# Patient Record
Sex: Male | Born: 1980 | Race: Black or African American | Hispanic: No | Marital: Single | Smoking: Never smoker
Health system: Southern US, Community
[De-identification: ages and names within clinical notes are randomized; demographics above are authoritative.]

## PROBLEM LIST (undated history)

## (undated) DIAGNOSIS — I10 Essential (primary) hypertension: Secondary | ICD-10-CM

## (undated) DIAGNOSIS — K029 Dental caries, unspecified: Secondary | ICD-10-CM

## (undated) HISTORY — PX: HERNIA REPAIR: SHX51

## (undated) HISTORY — DX: Essential (primary) hypertension: I10

---

## 2003-03-04 ENCOUNTER — Emergency Department (HOSPITAL_COMMUNITY): Admission: EM | Admit: 2003-03-04 | Discharge: 2003-03-04 | Payer: Self-pay | Admitting: Emergency Medicine

## 2005-03-04 IMAGING — CR DG ABDOMEN ACUTE W/ 1V CHEST
3 series · 3 of 3 positions shown · non-contrast
Comparison: None.

CLINICAL DATA: Upper abdominal pain, nausea/vomiting; cough, fever.
 ACUTE ABDOMEN SERIES (AP AND ERECT ABDOMEN WITH PA CHEST)  03/04/03 AT 5621 HOURS

[view not recorded (1 of 3)]
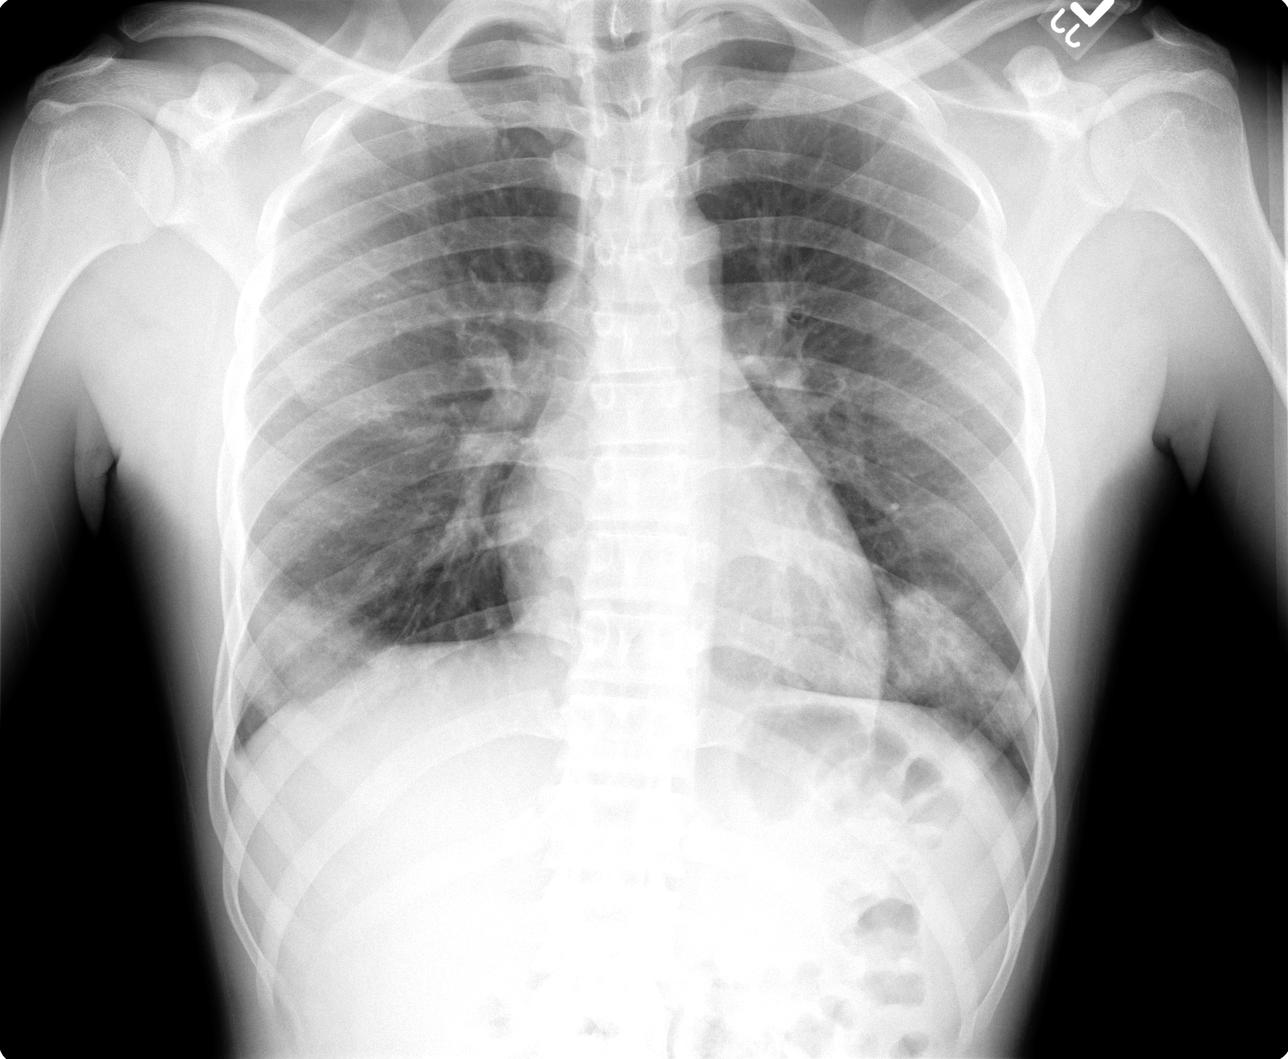

[view not recorded (2 of 3)]
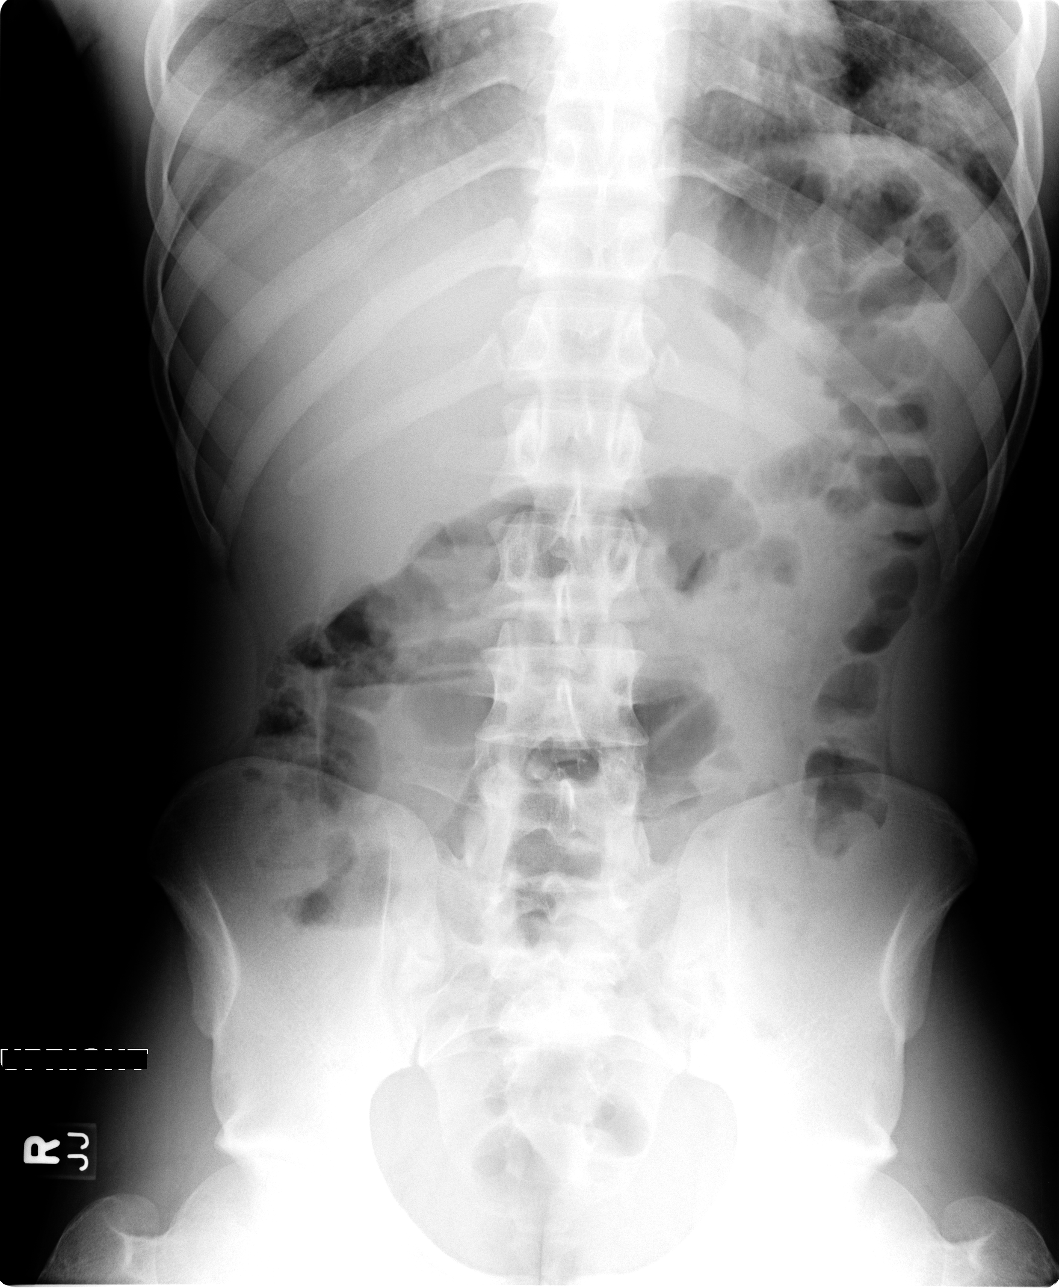

[view not recorded (3 of 3)]
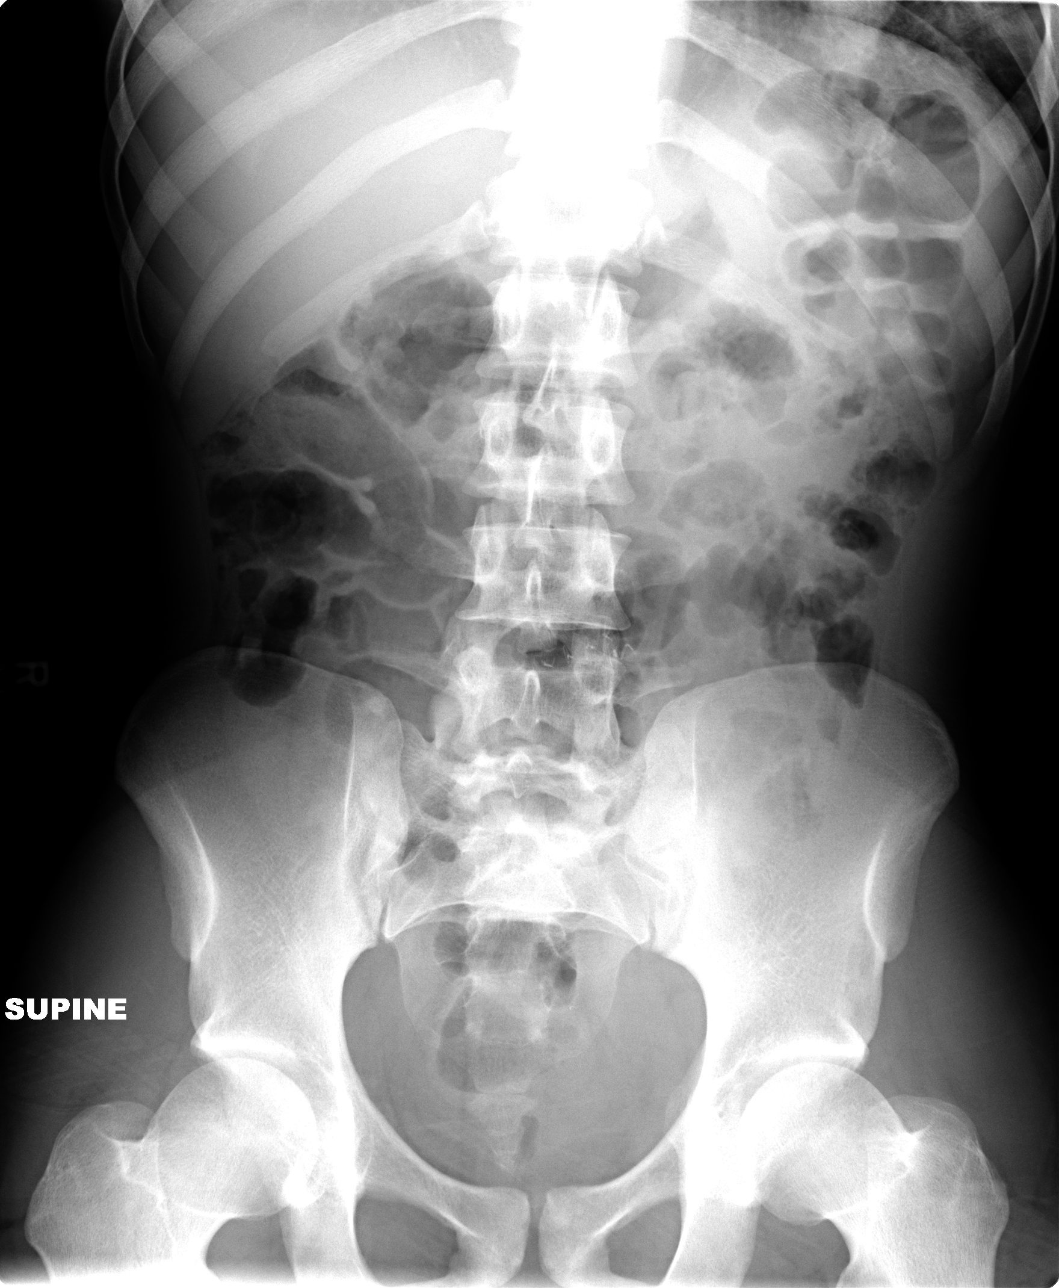

[3 of 3 positions shown; findings below may reference images not displayed]

The bowel gas pattern is unremarkable, and there is no evidence of obstruction or free air.  No abnormal calcifications are identified.  There is surgical suture material present within the mid abdomen.  Skeletal structures appear intact.
 The accompanying PA chest film demonstrates consolidation in both lower lobes.  The cardiomediastinal silhouette is unremarkable.  
 IMPRESSION
 1.  No acute abdominal abnormality.
 2.  Bilateral lower lobe pneumonia.

## 2010-09-13 ENCOUNTER — Emergency Department (HOSPITAL_COMMUNITY): Payer: Self-pay

## 2010-09-13 ENCOUNTER — Emergency Department (HOSPITAL_COMMUNITY)
Admission: EM | Admit: 2010-09-13 | Discharge: 2010-09-13 | Disposition: A | Discharge status: Home / Self Care | Payer: Self-pay | Attending: Emergency Medicine | Admitting: Emergency Medicine

## 2010-09-13 DIAGNOSIS — R079 Chest pain, unspecified: Secondary | ICD-10-CM | POA: Insufficient documentation

## 2010-09-13 LAB — BASIC METABOLIC PANEL
CO2: 27 mEq/L (ref 19–32)
Chloride: 101 mEq/L (ref 96–112)
Potassium: 3.9 mEq/L (ref 3.5–5.1)
Sodium: 137 mEq/L (ref 135–145)

## 2010-09-13 LAB — DIFFERENTIAL
Basophils Relative: 1 % (ref 0–1)
Eosinophils Absolute: 0.1 10*3/uL (ref 0.0–0.7)
Lymphs Abs: 3.7 10*3/uL (ref 0.7–4.0)
Neutrophils Relative %: 36 % — ABNORMAL LOW (ref 43–77)

## 2010-09-13 LAB — CBC
MCV: 82.1 fL (ref 78.0–100.0)
Platelets: 250 10*3/uL (ref 150–400)
RBC: 5.42 MIL/uL (ref 4.22–5.81)
WBC: 6.9 10*3/uL (ref 4.0–10.5)

## 2010-09-13 LAB — POCT I-STAT TROPONIN I

## 2012-09-13 IMAGING — CR DG CHEST 2V
2 series · 2 of 2 positions shown · non-contrast
Comparison: None

CLINICAL DATA: Chest pain

CHEST - 2 VIEW

[w chest pa]
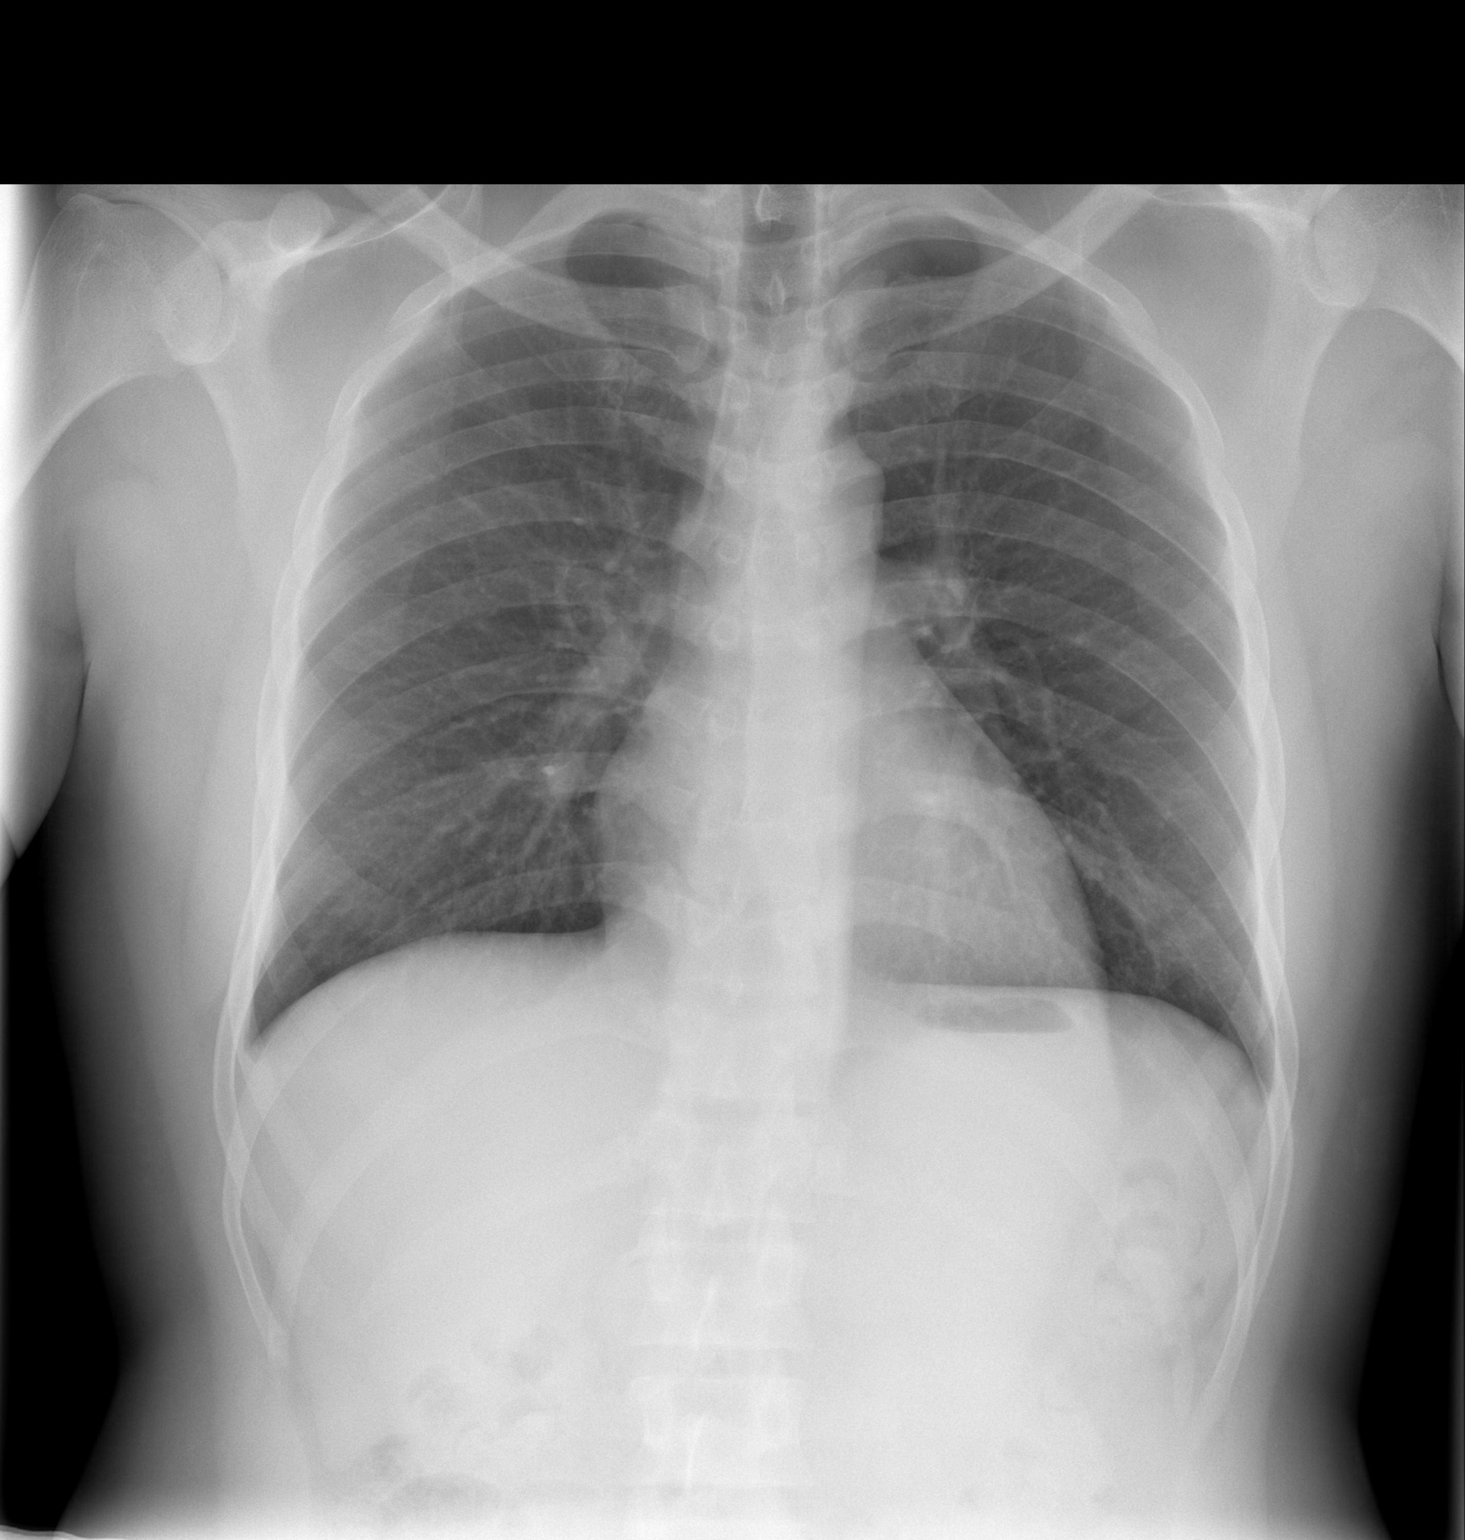

[w chest lat]
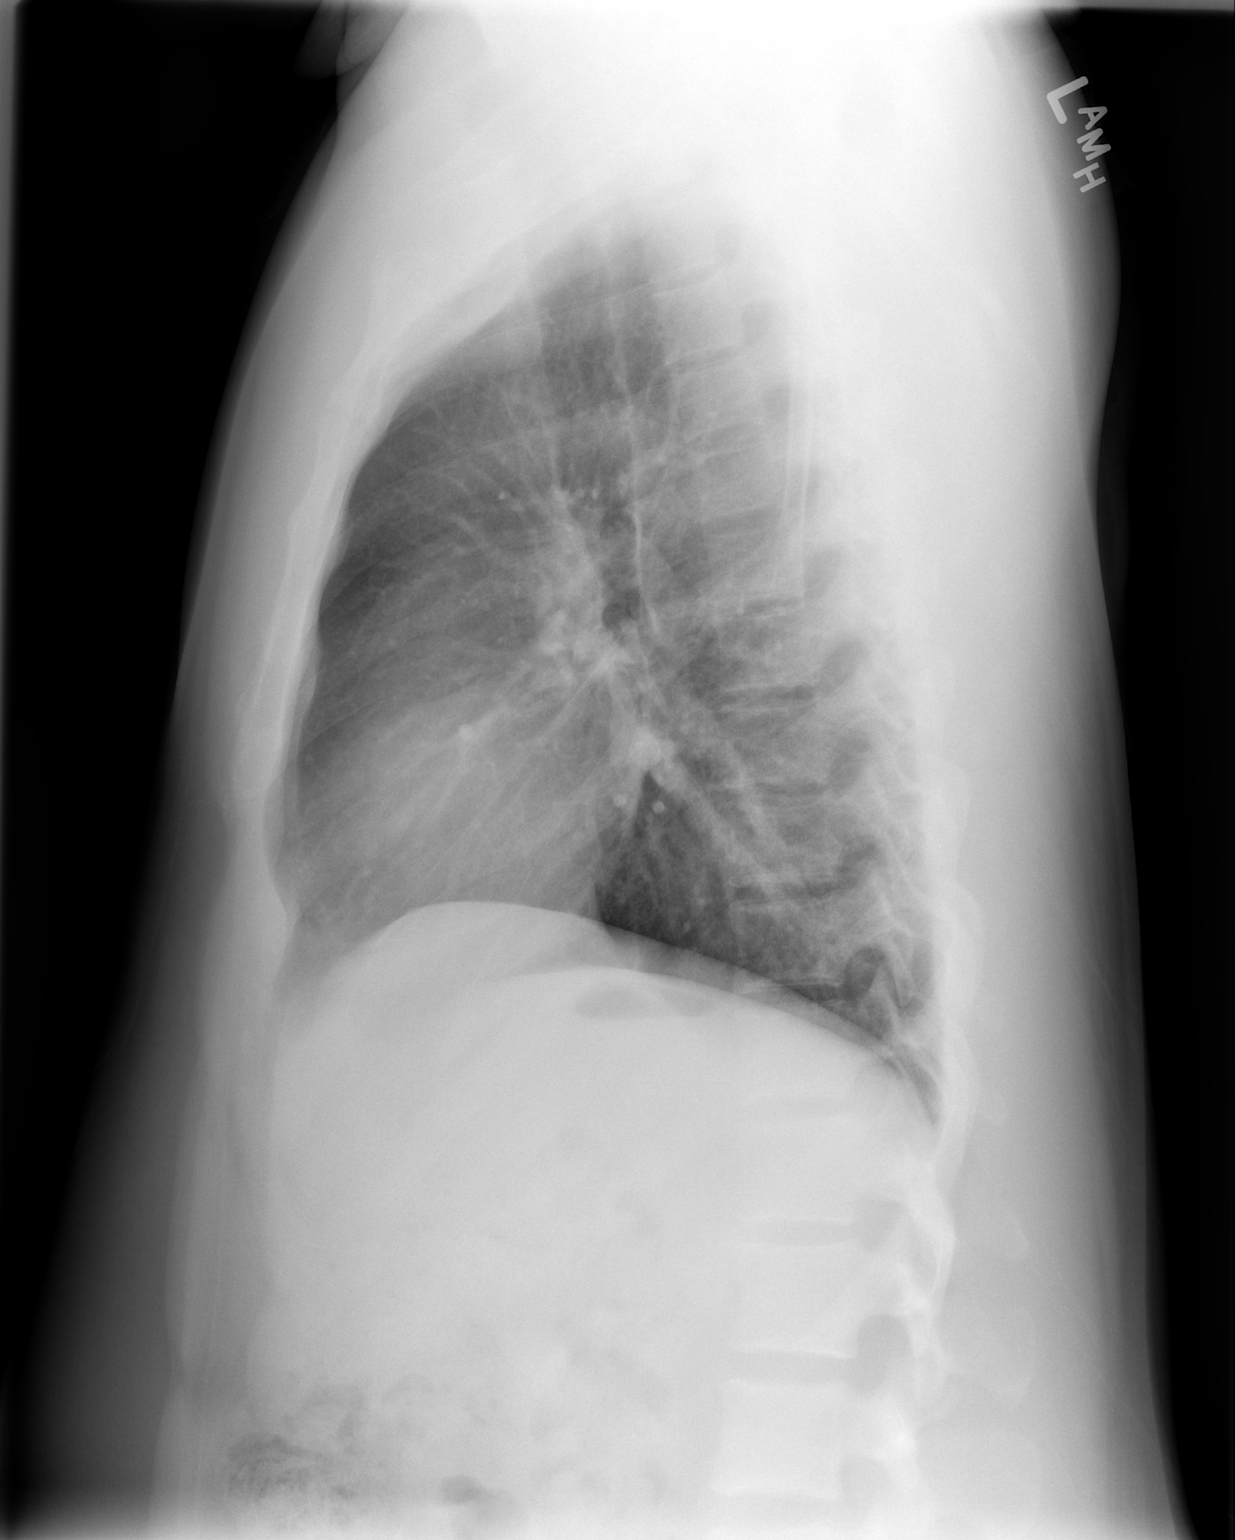

[2 of 2 positions shown; findings below may reference images not displayed]

FINDINGS: The heart size and mediastinal contours are within normal
limits.  Both lungs are clear.  The visualized skeletal structures
are unremarkable.
IMPRESSION: No active disease.

## 2012-09-15 ENCOUNTER — Encounter (HOSPITAL_COMMUNITY): Payer: Self-pay | Admitting: Emergency Medicine

## 2012-09-15 ENCOUNTER — Emergency Department (HOSPITAL_COMMUNITY)
Admission: EM | Admit: 2012-09-15 | Discharge: 2012-09-15 | Disposition: A | Discharge status: Home / Self Care | Payer: Self-pay | Attending: Emergency Medicine | Admitting: Emergency Medicine

## 2012-09-15 DIAGNOSIS — Z23 Encounter for immunization: Secondary | ICD-10-CM | POA: Insufficient documentation

## 2012-09-15 DIAGNOSIS — W268XXA Contact with other sharp object(s), not elsewhere classified, initial encounter: Secondary | ICD-10-CM | POA: Insufficient documentation

## 2012-09-15 DIAGNOSIS — Y9389 Activity, other specified: Secondary | ICD-10-CM | POA: Insufficient documentation

## 2012-09-15 DIAGNOSIS — Y9289 Other specified places as the place of occurrence of the external cause: Secondary | ICD-10-CM | POA: Insufficient documentation

## 2012-09-15 DIAGNOSIS — S61219A Laceration without foreign body of unspecified finger without damage to nail, initial encounter: Secondary | ICD-10-CM

## 2012-09-15 DIAGNOSIS — S61209A Unspecified open wound of unspecified finger without damage to nail, initial encounter: Secondary | ICD-10-CM | POA: Insufficient documentation

## 2012-09-15 HISTORY — DX: Dental caries, unspecified: K02.9

## 2012-09-15 MED ORDER — TETANUS-DIPHTH-ACELL PERTUSSIS 5-2.5-18.5 LF-MCG/0.5 IM SUSP
0.5000 mL | Freq: Once | INTRAMUSCULAR | Status: AC
  Administered 2012-09-15: 0.5 mL via INTRAMUSCULAR
  Filled 2012-09-15: qty 0.5

## 2012-09-15 NOTE — ED Provider Notes (Signed)
CSN: [id number]     Arrival date & time 09/15/12  1357 History   First MD Initiated Contact with Patient 09/15/12 1411     No chief complaint on file.  (Consider location/radiation/quality/duration/timing/severity/associated sxs/prior Treatment) HPI Comments: Patient reports he was cleaning out his car when he accidentally cut his right index finger on a straight razor that was under his seat. Reports he is having trouble stopping the bleeding. He is not on blood thinners. Denies weakness or numbness of the finger or difficulty moving his finger. Denies other injury. Unsure of last tetanus vaccination  The history is provided by the patient.    No past medical history on file. No past surgical history on file. No family history on file. History  Substance Use Topics  . Smoking status: Not on file  . Smokeless tobacco: Not on file  . Alcohol Use: Not on file    Review of Systems  Skin: Positive for wound. Negative for color change and pallor.  Neurological: Negative for weakness and numbness.    Allergies  Review of patient's allergies indicates not on file.  Home Medications  No current outpatient prescriptions on file. There were no vitals taken for this visit. Physical Exam  Nursing note and vitals reviewed. Constitutional: He appears well-developed and well-nourished. No distress.  HENT:  Head: Normocephalic and atraumatic.  Neck: Neck supple.  Pulmonary/Chest: Effort normal.  Musculoskeletal:       Right hand: He exhibits laceration. He exhibits normal range of motion, no bony tenderness, normal capillary refill, no deformity and no swelling. Normal sensation noted. Normal strength noted.       Hands: Neurological: He is alert.  Skin: He is not diaphoretic.    ED Course  Procedures (including critical care time) Labs Review Labs Reviewed - No data to display Imaging Review No results found. 2:20 PM patient seen and examined-he has a small laceration on his  distal tuft of his right index finger. Neurovascularly intact. I discussed the risks and benefits associated with closing the wound versus leaving it open. Patient states he does not like needles and does not want a digital block nor does he want sutures in his finger. He is on amoxicillin from a recent dental visit, which will cover him to prevent infection. We will thoroughly cleansed the wound and close it with Steri-Strips. Patient advised that his healing may be slower and that his risk of infection is increased as is his risk of wound dehiscence and bleeding. Patient verbalizes understanding.  MDM   1. Laceration of finger, initial encounter    Patient with small laceration to the distal end of right index finger. Wound thoroughly cleansed by nurse and Steri-Strips applied. Wound was hemostatic after Steri-Strips application. Wound bandaged here. Patient is to continue his amoxicillin at home to prevent infection. Patient to return for worsening symptoms including difficulty bending or straightening his finger, redness, swelling, or purulent drainage, or fevers.  Discussed result, findings, treatment, and follow up  with patient.  Pt given return precautions.  Pt verbalizes understanding and agrees with plan.        Trixie Dredge, PA-C 09/15/12 1500

## 2012-09-15 NOTE — Progress Notes (Signed)
[facility] provided pt with a list of primary care resources in [facility]. Patient stated that he had insurance, he was just waiting on his card.

## 2012-09-15 NOTE — ED Notes (Signed)
Pt attempt to pick up a discarded razor blade. Tip of 2nd finger r/hand has shallow laceration

## 2012-09-21 NOTE — ED Provider Notes (Signed)
Medical screening examination/treatment/procedure(s) were performed by non-physician practitioner and as supervising physician I was immediately available for consultation/collaboration.  Tremon Sainvil, MD 09/21/12 2101 

## 2013-03-13 ENCOUNTER — Emergency Department (HOSPITAL_COMMUNITY): Payer: Self-pay

## 2013-03-13 ENCOUNTER — Encounter (HOSPITAL_COMMUNITY): Payer: Self-pay | Admitting: Emergency Medicine

## 2013-03-13 ENCOUNTER — Emergency Department (HOSPITAL_COMMUNITY)
Admission: EM | Admit: 2013-03-13 | Discharge: 2013-03-13 | Disposition: A | Discharge status: Home / Self Care | Payer: Self-pay | Attending: Emergency Medicine | Admitting: Emergency Medicine

## 2013-03-13 DIAGNOSIS — Z792 Long term (current) use of antibiotics: Secondary | ICD-10-CM | POA: Insufficient documentation

## 2013-03-13 DIAGNOSIS — R0789 Other chest pain: Secondary | ICD-10-CM | POA: Insufficient documentation

## 2013-03-13 DIAGNOSIS — R0602 Shortness of breath: Secondary | ICD-10-CM | POA: Insufficient documentation

## 2013-03-13 DIAGNOSIS — Z8719 Personal history of other diseases of the digestive system: Secondary | ICD-10-CM | POA: Insufficient documentation

## 2013-03-13 LAB — BASIC METABOLIC PANEL
BUN: 12 mg/dL (ref 6–23)
CALCIUM: 10.4 mg/dL (ref 8.4–10.5)
CO2: 28 mEq/L (ref 19–32)
Chloride: 99 mEq/L (ref 96–112)
Creatinine, Ser: 1.22 mg/dL (ref 0.50–1.35)
GFR, EST NON AFRICAN AMERICAN: 78 mL/min — AB (ref >90)
GLUCOSE: 98 mg/dL (ref 70–99)
POTASSIUM: 4 meq/L (ref 3.7–5.3)
SODIUM: 141 meq/L (ref 137–147)

## 2013-03-13 LAB — CBC
HCT: 44.3 % (ref 39.0–52.0)
Hemoglobin: 15.4 g/dL (ref 13.0–17.0)
MCH: 29.6 pg (ref 26.0–34.0)
MCHC: 34.8 g/dL (ref 30.0–36.0)
MCV: 85 fL (ref 78.0–100.0)
PLATELETS: 273 10*3/uL (ref 150–400)
RBC: 5.21 MIL/uL (ref 4.22–5.81)
RDW: 13.6 % (ref 11.5–15.5)
WBC: 5.5 10*3/uL (ref 4.0–10.5)

## 2013-03-13 LAB — I-STAT TROPONIN, ED: TROPONIN I, POC: 0 ng/mL (ref 0.00–0.08)

## 2013-03-13 LAB — D-DIMER, QUANTITATIVE: D-Dimer, Quant: <0.27 ug/mL-FEU (ref 0.00–0.48)

## 2013-03-13 MED ORDER — ASPIRIN 81 MG PO CHEW
324.0000 mg | CHEWABLE_TABLET | Freq: Once | ORAL | Status: AC
  Administered 2013-03-13: 324 mg via ORAL
  Filled 2013-03-13: qty 4

## 2013-03-13 NOTE — ED Notes (Addendum)
Pt reports that he has had sharp centralized intermittent CP for the past week, reports it causes ShOB, states nothing relieves or worsens the pain, reports numbness to his L arm. 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 Denies hx of blood clots. Pt a&o x4, ambulatory to triage.

## 2013-03-13 NOTE — Discharge Instructions (Signed)
Call and make an appointment to followup with a cardiologist. Return immediately for worsening and sustained chest pain.  Chest Pain (Nonspecific) It is often hard to give a specific diagnosis for the cause of chest pain. There is always a chance that your pain could be related to something serious, such as a heart attack or a blood clot in the lungs. You need to follow up with your caregiver for further evaluation. CAUSES   Heartburn.  Pneumonia or bronchitis.  Anxiety or stress.  Inflammation around your heart (pericarditis) or lung (pleuritis or pleurisy).  A blood clot in the lung.  A collapsed lung (pneumothorax). It can develop suddenly on its own (spontaneous pneumothorax) or from injury (trauma) to the chest.  Shingles infection (herpes zoster virus). The chest wall is composed of bones, muscles, and cartilage. Any of these can be the source of the pain.  The bones can be bruised by injury.  The muscles or cartilage can be strained by coughing or overwork.  The cartilage can be affected by inflammation and become sore (costochondritis). DIAGNOSIS  Lab tests or other studies, such as X-rays, electrocardiography, stress testing, or cardiac imaging, may be needed to find the cause of your pain.  TREATMENT   Treatment depends on what may be causing your chest pain. Treatment may include:  Acid blockers for heartburn.  Anti-inflammatory medicine.  Pain medicine for inflammatory conditions.  Antibiotics if an infection is present.  You may be advised to change lifestyle habits. This includes stopping smoking and avoiding alcohol, caffeine, and chocolate.  You may be advised to keep your head raised (elevated) when sleeping. This reduces the chance of acid going backward from your stomach into your esophagus.  Most of the time, nonspecific chest pain will improve within 2 to 3 days with rest and mild pain medicine. HOME CARE INSTRUCTIONS   If antibiotics were  prescribed, take your antibiotics as directed. Finish them even if you start to feel better.  For the next few days, avoid physical activities that bring on chest pain. Continue physical activities as directed.  Do not smoke.  Avoid drinking alcohol.  Only take over-the-counter or prescription medicine for pain, discomfort, or fever as directed by your caregiver.  Follow your caregiver's suggestions for further testing if your chest pain does not go away.  Keep any follow-up appointments you made. If you do not go to an appointment, you could develop lasting (chronic) problems with pain. If there is any problem keeping an appointment, you must call to reschedule. SEEK MEDICAL CARE IF:   You think you are having problems from the medicine you are taking. Read your medicine instructions carefully.  Your chest pain does not go away, even after treatment.  You develop a rash with blisters on your chest. SEEK IMMEDIATE MEDICAL CARE IF:   You have increased chest pain or pain that spreads to your arm, neck, jaw, back, or abdomen.  You develop shortness of breath, an increasing cough, or you are coughing up blood.  You have severe back or abdominal pain, feel nauseous, or vomit.  You develop severe weakness, fainting, or chills.  You have a fever. THIS IS AN EMERGENCY. Do not wait to see if the pain will go away. Get medical help at once. Call your local emergency services (911 in [place] Do not drive yourself to the hospital. MAKE SURE YOU:   Understand these instructions.  Will watch your condition.  Will get help right away if you are not  doing well or get worse. Document Released: 10/09/2004 Document Revised: 03/24/2011 Document Reviewed: 08/05/2007 [facility] Patient Information 2014 [employer].

## 2013-03-13 NOTE — ED Provider Notes (Signed)
CSN: [id number]     Arrival date & time 03/13/13  0424 History   First MD Initiated Contact with Patient 03/13/13 0444     Chief Complaint  Patient presents with  . Chest Pain     (Consider location/radiation/quality/duration/timing/severity/associated sxs/prior Treatment) HPI Patient has had one week of episodic left-sided chest pain. He states the episodes last 10-15 seconds. The pain is characterized as sharp and radiates up to his neck and left shoulder. Is associated with mild shortness of breath but no nausea or vomiting. He denies any cough, fevers or chills. The last episode of chest pain was while at work yesterday at 4 PM roughly 12 hours ago. The pain that time lasted 15 seconds and dissipated. Patient has no current pain or shortness of breath. Patient was seen in emergency Department roughly 3 years ago for similar symptoms. He had a negative workup at that time. Patient's never seen a cardiologist. He does not smoke cigarettes. 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 She's had no lower extremity swelling or pain. His leg father had MI in his 1350s. Past Medical History  Diagnosis Date  . Dental caries    Past Surgical History  Procedure Laterality Date  . Hernia repair     Family History  Problem Relation Age of Onset  . Diabetes Other   . Hypertension Other    History  Substance Use Topics  . Smoking status: Never Smoker   . Smokeless tobacco: Not on file  . Alcohol Use: Yes    Review of Systems  Constitutional: Negative for fever and chills.  Respiratory: Positive for shortness of breath. Negative for cough.   Cardiovascular: Positive for chest pain. Negative for palpitations and leg swelling.  Gastrointestinal: Negative for nausea, vomiting and abdominal pain.  Musculoskeletal: Negative for back pain, neck pain and neck stiffness.  Skin: Negative for rash.  Neurological: Negative for dizziness, weakness, light-headedness, numbness and headaches.   All other systems reviewed and are negative.      Allergies  Review of patient's allergies indicates no known allergies.  Home Medications   Current Outpatient Rx  Name  Route  Sig  Dispense  Refill  . amoxicillin (AMOXIL) 500 MG capsule   Oral   Take 500 mg by mouth 3 (three) times daily.          BP 121/85  Pulse 106  Temp(Src) 98.6 F (37 C) (Oral)  Resp 16  SpO2 100% Physical Exam  Nursing note and vitals reviewed. Constitutional: He is oriented to person, place, and time. He appears well-developed and well-nourished. No distress.  HENT:  Head: Normocephalic and atraumatic.  Mouth/Throat: Oropharynx is clear and moist.  Eyes: EOM are normal. Pupils are equal, round, and reactive to light.  Neck: Normal range of motion. Neck supple.  Cardiovascular: Normal rate and regular rhythm.  Exam reveals no gallop and no friction rub.   No murmur heard. Pulmonary/Chest: Effort normal and breath sounds normal. No respiratory distress. He has no wheezes. He has no rales. He exhibits no tenderness.  Abdominal: Soft. Bowel sounds are normal. He exhibits no distension and no mass. There is no tenderness. There is no rebound and no guarding.  Musculoskeletal: Normal range of motion. He exhibits no edema and no tenderness.  No calf swelling or pain.  Neurological: He is alert and oriented to person, place, and time.  Patient is alert and oriented x3 with clear, goal oriented speech. Patient has 5/5 motor in all  extremities. Sensation is intact to light touch. Patient has a normal gait and walks without assistance.   Skin: Skin is warm and dry. No rash noted. No erythema.  Psychiatric: He has a normal mood and affect. His behavior is normal.    ED Course  Procedures (including critical care time) Labs Review Labs Reviewed  CBC  BASIC METABOLIC PANEL  I-STAT TROPOININ, ED   Imaging Review No results found.   EKG Interpretation   Date/Time:  Sunday March 13 2013 04:32:08  EST Ventricular Rate:  101 PR Interval:  153 QRS Duration: 83 QT Interval:  336 QTC Calculation: 435 R Axis:   63 Text Interpretation:  Sinus tachycardia Borderline repolarization  abnormality ST elevation, consider anterior injury Confirmed by Ranae Palms   MD, Bita Cartwright ([id number]) on 03/13/2013 5:02:03 AM      MDM   Final diagnoses:  None    Discussed EKG with cardiology fellow on call. He reviewed the EKG as well as his old one. He stated that there is no emergent intervention needed at this time and changes likely represent J-point elevation. Patient is chest pain free at this time. Symptoms would be atypical for coronary artery disease.  Patient has a negative troponin 12 hours after episode of chest pain. He has a normal d-dimer. EKG is unchanged from his previous. I discuss with cardiology and we'll refer him for outpatient workup. His symptoms are very atypical for coronary artery disease. I believe he is safe for discharge home. Return precautions have been given and patient has voiced understanding.  Loren Racer, MD 03/13/13 [phone]

## 2015-06-15 ENCOUNTER — Emergency Department (HOSPITAL_COMMUNITY)
Admission: EM | Admit: 2015-06-15 | Discharge: 2015-06-15 | Disposition: A | Discharge status: Home / Self Care | Payer: Self-pay | Attending: Emergency Medicine | Admitting: Emergency Medicine

## 2015-06-15 ENCOUNTER — Encounter (HOSPITAL_COMMUNITY): Payer: Self-pay | Admitting: *Deleted

## 2015-06-15 DIAGNOSIS — K029 Dental caries, unspecified: Secondary | ICD-10-CM | POA: Insufficient documentation

## 2015-06-15 DIAGNOSIS — K002 Abnormalities of size and form of teeth: Secondary | ICD-10-CM | POA: Insufficient documentation

## 2015-06-15 DIAGNOSIS — K0889 Other specified disorders of teeth and supporting structures: Secondary | ICD-10-CM | POA: Insufficient documentation

## 2015-06-15 MED ORDER — TRAMADOL HCL 50 MG PO TABS: 50.0000 mg | ORAL_TABLET | Freq: Four times a day (QID) | ORAL | Status: DC | PRN

## 2015-06-15 MED ORDER — PENICILLIN V POTASSIUM 500 MG PO TABS: 500.0000 mg | ORAL_TABLET | Freq: Three times a day (TID) | ORAL | Status: DC

## 2015-06-15 MED ORDER — NAPROXEN 500 MG PO TABS: 500.0000 mg | ORAL_TABLET | Freq: Two times a day (BID) | ORAL | Status: DC

## 2015-06-15 NOTE — ED Notes (Signed)
Pt states dental pain to 2 teeth x 1 week.  States called his dentists office today but he is not in.

## 2015-06-15 NOTE — ED Provider Notes (Signed)
CSN: [id number]     Arrival date & time 06/15/15  [id number] History  By signing my name below, I, Jesse Trevino, attest that this documentation has been prepared under the direction and in the presence of Jesse CriglerJoshua Parthena Fergeson, PA-C. Electronically Signed: Evon Slackerrance Trevino, ED Scribe. 06/15/2015. 10:46 AM.    Chief Complaint  Patient presents with  . Dental Pain   The history is provided by the patient. No language interpreter was used.   HPI Comments: Jesse Trevino is a 35 y.o. male who presents to the Emergency Department complaining of upper and lower left sided dental pain onset 1 week prior. Pt states that he has some slight left side facial swelling. Pt states that he has been trying tylenol with no relief. Pt doesn't reports fever, ear pain, trouble swallowing. Pt states that he has a dentist appointment next week.   Past Medical History  Diagnosis Date  . Dental caries    Past Surgical History  Procedure Laterality Date  . Hernia repair     Family History  Problem Relation Age of Onset  . Diabetes Other   . Hypertension Other    Social History  Substance Use Topics  . Smoking status: Never Smoker   . Smokeless tobacco: Never Used  . Alcohol Use: Yes     Comment: socially    Review of Systems  Constitutional: Negative for fever.  HENT: Positive for dental problem and facial swelling. Negative for ear pain, sore throat and trouble swallowing.   Respiratory: Negative for shortness of breath and stridor.   Musculoskeletal: Negative for neck pain.  Skin: Negative for color change.  Neurological: Negative for headaches.      Allergies  Review of patient's allergies indicates no known allergies.  Home Medications   Prior to Admission medications   Not on File   BP 131/88 mmHg  Pulse 88  Temp(Src) 98.5 F (36.9 C) (Oral)  Resp 16  SpO2 98%   Physical Exam  Constitutional: He is oriented to person, place, and time. He appears well-developed and well-nourished. No  distress.  HENT:  Head: Normocephalic and atraumatic.  Right Ear: Tympanic membrane, external ear and ear canal normal.  Left Ear: Tympanic membrane, external ear and ear canal normal.  Nose: Nose normal.  Mouth/Throat: Uvula is midline, oropharynx is clear and moist and mucous membranes are normal. No trismus in the jaw. Abnormal dentition. Dental caries present. No dental abscesses or uvula swelling. No tonsillar abscesses.  No gross facial swelling. Patient has large cavity in tooth #16 and tooth #19. These are the areas where he is having pain.  Eyes: Conjunctivae and EOM are normal. Pupils are equal, round, and reactive to light.  Neck: Normal range of motion. Neck supple. No tracheal deviation present.  No neck swelling or Lugwig's angina  Cardiovascular: Normal rate.   Pulmonary/Chest: Effort normal. No respiratory distress.  Musculoskeletal: Normal range of motion.  Neurological: He is alert and oriented to person, place, and time.  Skin: Skin is warm and dry.  Psychiatric: He has a normal mood and affect. His behavior is normal.  Nursing note and vitals reviewed.   ED Course  Procedures (including critical care time) DIAGNOSTIC STUDIES: Oxygen Saturation is 98% on RA, normal by my interpretation.    COORDINATION OF CARE: 11:02 AM-Discussed treatment plan which includes being prescribed penicillin, tramadol and naproxen  with pt at bedside and pt agreed to plan.   Vital signs reviewed and are as follows: BP 131/88 mmHg  Pulse 88  Temp(Src) 98.5 F (36.9 C) (Oral)  Resp 16  SpO2 98%  Patient counseled on use of narcotic pain medications. Counseled not to combine these medications with others containing tylenol. Urged not to drink alcohol, drive, or perform any other activities that requires focus while taking these medications. The patient verbalizes understanding and agrees with the plan.  Patient counseled to take prescribed medications as directed, return with  worsening facial or neck swelling, and to follow-up with their dentist as soon as possible.    MDM   Final diagnoses:  Pain, dental   Patient with toothache. No fever. Exam unconcerning for Ludwig's angina or other deep tissue infection in neck.   As there is subjective facial swelling, will treat with antibiotic and pain medicine. Urged patient to follow-up with dentist.      I personally performed the services described in this documentation, which was scribed in my presence. The recorded information has been reviewed and is accurate.      Jesse Crigler, PA-C 06/15/15 1106  Derwood Kaplan, MD 06/15/15 1624

## 2015-06-15 NOTE — Discharge Instructions (Signed)
Please read and follow all provided instructions.  Your diagnoses today include:  1. Pain, dental     The exam and treatment you received today has been provided on an emergency basis only. This is not a substitute for complete medical or dental care.  Tests performed today include:  Vital signs. See below for your results today.   Medications prescribed:   Penicillin - antibiotic  You have been prescribed an antibiotic medicine: take the entire course of medicine even if you are feeling better. Stopping early can cause the antibiotic not to work.   Naproxen - anti-inflammatory pain medication  Do not exceed 500mg  naproxen every 12 hours, take with food  You have been prescribed an anti-inflammatory medication or NSAID. Take with food. Take smallest effective dose for the shortest duration needed for your pain. Stop taking if you experience stomach pain or vomiting.    Tramadol - narcotic-like pain medication  DO NOT drive or perform any activities that require you to be awake and alert because this medicine can make you drowsy.   Take any prescribed medications only as directed.  Home care instructions:  Follow any educational materials contained in this packet.  Follow-up instructions: Please follow-up with your dentist for further evaluation of your symptoms.   Dental Assistance: See below for dental referrals  Return instructions:   Please return to the Emergency Department if you experience worsening symptoms.  Please return if you develop a fever, you develop more swelling in your face or neck, you have trouble breathing or swallowing food.  Please return if you have any other emergent concerns.  Additional Information:  Your vital signs today were: BP 131/88 mmHg   Pulse 88   Temp(Src) 98.5 F (36.9 C) (Oral)   Resp 16   SpO2 98% If your blood pressure (BP) was elevated above 135/85 this visit, please have this repeated by your doctor within one  month.

## 2016-01-03 ENCOUNTER — Ambulatory Visit (INDEPENDENT_AMBULATORY_CARE_PROVIDER_SITE_OTHER): Payer: Self-pay | Admitting: Urgent Care

## 2016-01-03 ENCOUNTER — Encounter: Payer: Self-pay | Admitting: Urgent Care

## 2016-01-03 VITALS — BP 120/80 | HR 93 | Temp 99.2°F | Resp 16 | Ht 66.0 in | Wt 193.0 lb

## 2016-01-03 DIAGNOSIS — Z024 Encounter for examination for driving license: Secondary | ICD-10-CM

## 2016-01-03 NOTE — Progress Notes (Signed)
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 Has an occasional drink of alcohol. Denies smoking cigarettes. Denies dizziness, chronic headache, blurred vision, chest pain, shortness of breath, heart racing, palpitations, nausea, vomiting, abdominal pain, hematuria, weakness, numbness or tingling. Has a history of hernia repair as a baby, without sequelae.  The following portions of the patient's history were reviewed and updated as appropriate: allergies, current medications, past family history, past medical history, past social history and past surgical history.  Objective:   BP 120/80   Pulse 93   Temp 99.2 F (37.3 C) (Oral)   Resp 16   Ht 5\' 6"  (1.676 m)   Wt 193 lb (87.5 kg)   SpO2 98%   BMI 31.15 kg/m   Vision/hearing:  Visual Acuity Screening   Right eye Left eye Both eyes  Without correction: 20/20 20/20 20/13   With correction:     Comments: Color Vision: 6/6 Normal;  Peripheral Vision:  85 degrees both eyes  Hearing Screening Comments: Whisper Test: 5 ft Normal  Patient can recognize and distinguish among traffic control signals and devices showing standard red, green, and amber colors.  Corrective lenses required: No  Monocular Vision?: No  Hearing aid requirement: No  Physical Exam  Constitutional: He is oriented to person, place, and time. He appears well-developed and well-nourished.  HENT:  TM's intact bilaterally, no effusions or erythema. Nasal turbinates pink and moist, nasal passages patent. No sinus tenderness. Oropharynx clear, mucous membranes moist, dentition in good repair.  Eyes: Conjunctivae and EOM are normal. Pupils are equal, round, and reactive to light. Right eye exhibits no discharge. Left eye exhibits no discharge. No scleral icterus.  Neck: Normal range of motion. Neck supple. No thyromegaly present.  Cardiovascular: Normal rate, regular rhythm and intact distal  pulses.  Exam reveals no gallop and no friction rub.   No murmur heard. Pulmonary/Chest: No stridor. No respiratory distress. He has no wheezes. He has no rales.  Abdominal: Soft. Bowel sounds are normal. He exhibits no distension and no mass. There is no tenderness.  Musculoskeletal: Normal range of motion. He exhibits no edema or tenderness.  Lymphadenopathy:    He has no cervical adenopathy.  Neurological: He is alert and oriented to person, place, and time. He has normal reflexes.  Skin: Skin is warm and dry. No rash noted. No erythema. No pallor.  Psychiatric: He has a normal mood and affect.   Labs: Comments: SP GR: 1.020; Protein: Trace; Blood: Trace; Glucose: Negative  Assessment:    Healthy male exam.  Meets standards in 9049 CFR 391.41;  qualifies for 2 year certificate.    Plan:   Medical examiners certificate completed and printed. Return as needed.

## 2016-01-03 NOTE — Patient Instructions (Addendum)

## 2022-06-17 ENCOUNTER — Ambulatory Visit
Admit: 2022-06-17 | Discharge: 2022-06-17 | Disposition: A | Discharge status: Home / Self Care | Payer: BC Managed Care – PPO

## 2022-06-17 ENCOUNTER — Telehealth: Payer: BC Managed Care – PPO | Admitting: Physician Assistant

## 2022-06-17 ENCOUNTER — Ambulatory Visit
Admission: EM | Admit: 2022-06-17 | Discharge: 2022-06-17 | Disposition: A | Discharge status: Home / Self Care | Payer: BC Managed Care – PPO | Attending: Urgent Care | Admitting: Urgent Care

## 2022-06-17 DIAGNOSIS — R519 Headache, unspecified: Secondary | ICD-10-CM | POA: Diagnosis not present

## 2022-06-17 DIAGNOSIS — I10 Essential (primary) hypertension: Secondary | ICD-10-CM

## 2022-06-17 DIAGNOSIS — R42 Dizziness and giddiness: Secondary | ICD-10-CM

## 2022-06-17 MED ORDER — AMLODIPINE BESYLATE 5 MG PO TABS: 5.0000 mg | ORAL_TABLET | Freq: Every day | ORAL | 0 refills | Status: DC

## 2022-06-17 NOTE — ED Triage Notes (Addendum)
Pt presents with c/o headache, dizziness X 1 wk.   L arm numbing that started last night. BP has been hight for 1 wk.   States after he ate he started to feel the sxs

## 2022-06-17 NOTE — ED Provider Notes (Signed)
[facility]  Note:  This document was prepared using Conservation officer, historic buildings and may include unintentional dictation errors.  MRN: [id number] DOB: February 08, 1980  Subjective:   Jesse Trevino is a 42 y.o. male presenting for 1 week history of persistent generalized headaches and intermittent dizziness.  No headaches active in clinic but he does have some dizziness.  Felt some tingling sensation in his left arm last night but is resolved today.  No confusion, weakness, difficulty with speech, facial droop, chest pain, heart racing, palpitations, left-sided neck or jaw pain, nausea, vomiting, abdominal pain, hematuria.  No drug use.  No smoking.  No diabetes.  No family history of stroke or heart attack.  No current facility-administered medications for this encounter.  Current Outpatient Medications:    naproxen (NAPROSYN) 500 MG tablet, Take 1 tablet (500 mg total) by mouth 2 (two) times daily. (Patient not taking: Reported on 01/03/2016), Disp: 20 tablet, Rfl: 0   traMADol (ULTRAM) 50 MG tablet, Take 1 tablet (50 mg total) by mouth every 6 (six) hours as needed. (Patient not taking: Reported on 01/03/2016), Disp: 8 tablet, Rfl: 0   No Known Allergies  Past Medical History:  Diagnosis Date   Dental caries      Past Surgical History:  Procedure Laterality Date   HERNIA REPAIR      Family History  Problem Relation Age of Onset   Diabetes Other    Hypertension Other     Social History   Tobacco Use   Smoking status: Never   Smokeless tobacco: Never  Substance Use Topics   Alcohol use: Yes    Comment: socially   Drug use: No    ROS   Objective:   Vitals: BP (!) 151/102 (BP Location: Left Arm)   Pulse 97   Temp 99 F (37.2 C) (Oral)   Resp 15   SpO2 98%   BP Readings from Last 3 Encounters:  06/17/22 (!) 151/102  01/03/16 120/80  06/15/15 131/88   Physical Exam Constitutional:      General: He is not in acute distress.     Appearance: Normal appearance. He is well-developed and normal weight. He is not ill-appearing, toxic-appearing or diaphoretic.  HENT:     Head: Normocephalic and atraumatic.     Right Ear: External ear normal.     Left Ear: External ear normal.     Nose: Nose normal.     Mouth/Throat:     Mouth: Mucous membranes are moist.     Pharynx: Oropharynx is clear.  Eyes:     General: No scleral icterus.       Right eye: No discharge.        Left eye: No discharge.     Extraocular Movements: Extraocular movements intact.  Neck:     Meningeal: Brudzinski's sign and Kernig's sign absent.  Cardiovascular:     Rate and Rhythm: Normal rate and regular rhythm.     Heart sounds: Normal heart sounds. No murmur heard.    No friction rub. No gallop.  Pulmonary:     Effort: Pulmonary effort is normal. No respiratory distress.     Breath sounds: Normal breath sounds. No stridor. No wheezing, rhonchi or rales.  Musculoskeletal:     Cervical back: Normal range of motion.  Neurological:     Mental Status: He is alert and oriented to person, place, and time.     Cranial Nerves: No cranial nerve deficit, dysarthria or facial  asymmetry.     Motor: No weakness, abnormal muscle tone or pronator drift.     Coordination: Romberg sign negative. Coordination normal. Finger-Nose-Finger Test and Heel to [facility] Test normal. Rapid alternating movements normal.     Gait: Gait normal.     Deep Tendon Reflexes: Reflexes normal.  Psychiatric:        Mood and Affect: Mood normal.        Speech: Speech normal.        Behavior: Behavior normal.        Thought Content: Thought content normal.        Judgment: Judgment normal.     Assessment and Plan :   PDMP not reviewed this encounter.  1. Essential hypertension   2. Generalized headaches   3. Dizziness    Patient has a normal neurologic exam and clear cardiopulmonary exam.  Recommended starting amlodipine for undiagnosed and untreated hypertension.  Recommended  hypertensive friendly diet.  He has an appointment set up for a new PCP in the next month.  Counseled patient on potential for adverse effects with medications prescribed/recommended today, strict ER and return-to-clinic precautions discussed, patient verbalized understanding.    Wallis Bamberg, [place] 06/17/22 1950

## 2022-06-17 NOTE — Discharge Instructions (Signed)
For diabetes or elevated blood sugar, please make sure you are limiting and avoiding starchy, carbohydrate foods like pasta, breads, sweet breads, pastry, rice, potatoes, desserts. These foods can elevate your blood sugar. Also, limit and avoid drinks that contain a lot of sugar such as sodas, sweet teas, fruit juices.  Drinking plain water will be much more helpful, try 64 ounces of water daily.  It is okay to flavor your water naturally by cutting cucumber, lemon, mint or lime, placing it in a picture with water and drinking it over a period of 24-48 hours as long as it remains refrigerated.  For elevated blood pressure, make sure you are monitoring salt in your diet.  Do not eat restaurant foods and limit processed foods at home. I highly recommend you prepare and cook your own foods at home.  Processed foods include things like frozen meals, pre-seasoned meats and dinners, deli meats, canned foods as these foods contain a high amount of sodium/salt.  Make sure you are paying attention to sodium labels on foods you buy at the grocery store. Buy your spices separately such as garlic powder, onion powder, cumin, cayenne, parsley flakes so that you can avoid seasonings that contain salt. However, salt-free seasonings are available and can be used, an example is Mrs. Dash and includes a lot of different mixtures that do not contain salt.  Lastly, when cooking using oils that are healthier for you is important. This includes olive oil, avocado oil, canola oil. We have discussed a lot of foods to avoid but below is a list of foods that can be very healthy to use in your diet whether it is for diabetes, cholesterol, high blood pressure, or in general healthy eating.  Salads - kale, spinach, cabbage, spring mix, arugula Fruits - avocadoes, berries (blueberries, raspberries, blackberries), apples, oranges, pomegranate, grapefruit, kiwi Vegetables - asparagus, cauliflower, broccoli, green beans, brussel sprouts,  bell peppers, beets; stay away from or limit starchy vegetables like potatoes, carrots, peas Other general foods - kidney beans, egg whites, almonds, walnuts, sunflower seeds, pumpkin seeds, fat free yogurt, almond milk, flax seeds, quinoa, oats  Meat - It is better to eat lean meats and limit your red meat including pork to once a week.  Wild caught fish, chicken breast are good options as they tend to be leaner sources of good protein. Still be mindful of the sodium labels for the meats you buy.  DO NOT EAT ANY FOODS ON THIS LIST THAT YOU ARE ALLERGIC TO. For more specific needs, I highly recommend consulting a dietician or nutritionist but this can definitely be a good starting point.  

## 2022-06-17 NOTE — Progress Notes (Signed)
Virtual Visit Consent   Jesse Trevino, you are scheduled for a virtual visit with a [facility] provider today. Just as with appointments in the office, your consent must be obtained to participate. Your consent will be active for this visit and any virtual visit you may have with one of our providers in the next 365 days. If you have a MyChart account, a copy of this consent can be sent to you electronically.  As this is a virtual visit, video technology does not allow for your provider to perform a traditional examination. This may limit your provider's ability to fully assess your condition. If your provider identifies any concerns that need to be evaluated in person or the need to arrange testing (such as labs, EKG, etc.), we will make arrangements to do so. Although advances in technology are sophisticated, we cannot ensure that it will always work on either your end or our end. If the connection with a video visit is poor, the visit may have to be switched to a telephone visit. With either a video or telephone visit, we are not always able to ensure that we have a secure connection.  By engaging in this virtual visit, you consent to the provision of healthcare and authorize for your insurance to be billed (if applicable) for the services provided during this visit. Depending on your insurance coverage, you may receive a charge related to this service.  I need to obtain your verbal consent now. Are you willing to proceed with your visit today? Jesse Trevino has provided verbal consent on 06/17/2022 for a virtual visit (video or telephone). Jesse Trevino, [place]  Date: 06/17/2022 6:05 PM  Virtual Visit via Video Note   I, Jesse Trevino, connected with  Tirell Shrum  ([id number], 03/10/1980) on 06/17/22 at  6:00 PM EDT by a video-enabled telemedicine application and verified that I am speaking with the correct person using two identifiers.  Location: Patient: Virtual Visit Location Patient:  Home Provider: Virtual Visit Location Provider: Home Office   I discussed the limitations of evaluation and management by telemedicine and the availability of in person appointments. The patient expressed understanding and agreed to proceed.    History of Present Illness: Jesse Trevino is a 42 y.o. who identifies as a male who was assigned male at birth, and is being seen today for about a week of frequent headaches associated with lightheadedness and occasional dizziness.  Headaches are impacting ability to sleep.  Wife who is an [employer] fundraiser checked his blood pressure earlier this afternoon and noted to be at 160/100.  Patient notes a mild headache currently without any lightheadedness, dizziness or vision change.  Denies chest pain, racing heart or shortness of breath.  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  Notes a substantial family history of both hypertension and diabetes.  Denies any known family history of heart attack or stroke.  Patient denies any recent change to diet or activity level.  Denies any change to stress.  Some recent decrease in sleep, but mainly due to headache.  Wife does note he snores quite a bit while sleeping.  Denies any noted apneic episodes however.  Does not currently have a primary care provider but is scheduled for new patient appointment at the end of this month.  He was unsure what to do in the meantime.   HPI: HPI  Problems: There are no problems to display for  this patient.   Allergies: No Known Allergies Medications: No current outpatient medications on file.  Observations/Objective: Patient is well-developed, well-nourished in no acute distress.  Resting comfortably at home.  Head is normocephalic, atraumatic.  No labored breathing. Speech is clear and coherent with logical content.  Patient is alert and oriented at baseline.   Assessment and Plan: 1. Hypertension,  unspecified type  Symptomatic with frequent headaches and some occasional sensation of lightheadedness or dizziness none at present.  Denies any chest pain or shortness of breath.  BP substantially elevated this evening at 160/100.  No reported known triggers.  Needs further evaluation than what can be provided via video visit.  Link sent for him to be evaluated in person for manual BP check, detailed heart, lung and eye exam and lab assessment.  Likely needs to be started on an antihypertensive while further workup is completed.  Strict ER precautions reviewed.  Follow Up Instructions: I discussed the assessment and treatment plan with the patient. The patient was provided an opportunity to ask questions and all were answered. The patient agreed with the plan and demonstrated an understanding of the instructions.  A copy of instructions were sent to the patient via MyChart unless otherwise noted below.   The patient was advised to call back or seek an in-person evaluation if the symptoms worsen or if the condition fails to improve as anticipated.  Time:  I spent 10 minutes with the patient via telehealth technology discussing the above problems/concerns.    Jesse Climes, PA-C

## 2022-06-17 NOTE — Patient Instructions (Signed)
Jesse Trevino, thank you for joining [name], PA-C for today's virtual visit.  While this provider is not your primary care provider (PCP), if your PCP is located in our provider database this encounter information will be shared with them immediately following your visit.   A [facility] MyChart account gives you access to today's visit and all your visits, tests, and labs performed at [facility] " click here if you don't have a [facility] MyChart account or go to [email or web address]  Consent: (Patient) Jesse Trevino provided verbal consent for this virtual visit at the beginning of the encounter.  Current Medications: No current outpatient medications on file.   Medications ordered in this encounter:  No orders of the defined types were placed in this encounter.    *If you need refills on other medications prior to your next appointment, please contact your pharmacy*  Follow-Up: Call back or seek an in-person evaluation if the symptoms worsen or if the condition fails to improve as anticipated.  [facility] [phone]  Other Instructions Please keep well-hydrated and try to rest. Avoid any alcohol or caffeine as these can contribute to blood pressure. Follow the dietary recommendations below. I want you to use the link below to be evaluated in person this evening or first thing in the morning. If anything acutely worsens you need an ER evaluation.  Do not delay care.  Heart-Healthy Eating Plan Eating a healthy diet is important for the health of your heart. A heart-healthy eating plan includes: Eating less unhealthy fats. Eating more healthy fats. Eating less salt in your food. Salt is also called sodium. Making other changes in your diet. Talk with your doctor or a diet specialist (dietitian) to create an eating plan that is right for you. What is my plan? Your doctor may recommend an eating plan that includes: Total fat:  ______% or less of total calories a day. Saturated fat: ______% or less of total calories a day. Cholesterol: less than _________mg a day. Sodium: less than _________mg a day. What are tips for following this plan? Cooking Avoid frying your food. Try to bake, boil, grill, or broil it instead. You can also reduce fat by: Removing the skin from poultry. Removing all visible fats from meats. Steaming vegetables in water or broth. Meal planning  At meals, divide your plate into four equal parts: Fill one-half of your plate with vegetables and green salads. Fill one-fourth of your plate with whole grains. Fill one-fourth of your plate with lean protein foods. Eat 2-4 cups of vegetables per day. One cup of vegetables is: 1 cup (91 g) broccoli or cauliflower florets. 2 medium carrots. 1 large bell pepper. 1 large sweet potato. 1 large tomato. 1 medium white potato. 2 cups (150 g) raw leafy greens. Eat 1-2 cups of fruit per day. One cup of fruit is: 1 small apple 1 large banana 1 cup (237 g) mixed fruit, 1 large orange,  cup (82 g) dried fruit, 1 cup (240 mL) 100% fruit juice. Eat more foods that have soluble fiber. These are apples, broccoli, carrots, beans, peas, and barley. Try to get 20-30 g of fiber per day. Eat 4-5 servings of nuts, legumes, and seeds per week: 1 serving of dried beans or legumes equals  cup (90 g) cooked. 1 serving of nuts is  oz (12 almonds, 24 pistachios, or 7 walnut halves). 1 serving of seeds equals  oz (8 g). General information Eat more home-cooked food. Eat  less restaurant, buffet, and fast food. Limit or avoid alcohol. Limit foods that are high in starch and sugar. Avoid fried foods. Lose weight if you are overweight. Keep track of how much salt (sodium) you eat. This is important if you have high blood pressure. Ask your doctor to tell you more about this. Try to add vegetarian meals each week. Fats Choose healthy fats. These include olive  oil and canola oil, flaxseeds, walnuts, almonds, and seeds. Eat more omega-3 fats. These include salmon, mackerel, sardines, tuna, flaxseed oil, and ground flaxseeds. Try to eat fish at least 2 times each week. Check food labels. Avoid foods with trans fats or high amounts of saturated fat. Limit saturated fats. These are often found in animal products, such as meats, butter, and cream. These are also found in plant foods, such as palm oil, palm kernel oil, and coconut oil. Avoid foods with partially hydrogenated oils in them. These have trans fats. Examples are stick margarine, some tub margarines, cookies, crackers, and other baked goods. What foods should I eat? Fruits All fresh, canned (in natural juice), or frozen fruits. Vegetables Fresh or frozen vegetables (raw, steamed, roasted, or grilled). Green salads. Grains Most grains. Choose whole wheat and whole grains most of the time. Rice and pasta, including brown rice and pastas made with whole wheat. Meats and other proteins Lean, well-trimmed beef, veal, pork, and lamb. Chicken and [place] without skin. All fish and shellfish. Wild duck, rabbit, pheasant, and venison. Egg whites or low-cholesterol egg substitutes. Dried beans, peas, lentils, and tofu. Seeds and most nuts. Dairy Low-fat or nonfat cheeses, including ricotta and mozzarella. Skim or 1% milk that is liquid, powdered, or evaporated. Buttermilk that is made with low-fat milk. Nonfat or low-fat yogurt. Fats and oils Non-hydrogenated (trans-free) margarines. Vegetable oils, including soybean, sesame, sunflower, olive, peanut, safflower, corn, canola, and cottonseed. Salad dressings or mayonnaise made with a vegetable oil. Beverages Mineral water. Coffee and tea. Diet carbonated beverages. Sweets and desserts Sherbet, gelatin, and fruit ice. Small amounts of dark chocolate. Limit all sweets and desserts. Seasonings and condiments All seasonings and condiments. The items listed  above may not be a complete list of foods and drinks you can eat. Contact a dietitian for more options. What foods should I avoid? Fruits Canned fruit in heavy syrup. Fruit in cream or butter sauce. Fried fruit. Limit coconut. Vegetables Vegetables cooked in cheese, cream, or butter sauce. Fried vegetables. Grains Breads that are made with saturated or trans fats, oils, or whole milk. Croissants. Sweet rolls. Donuts. High-fat crackers, such as cheese crackers. Meats and other proteins Fatty meats, such as hot dogs, ribs, sausage, bacon, rib-eye roast or steak. High-fat deli meats, such as salami and bologna. Caviar. Domestic duck and goose. Organ meats, such as liver. Dairy Cream, sour cream, cream cheese, and creamed cottage cheese. Whole-milk cheeses. Whole or 2% milk that is liquid, evaporated, or condensed. Whole buttermilk. Cream sauce or high-fat cheese sauce. Yogurt that is made from whole milk. Fats and oils Meat fat, or shortening. Cocoa butter, hydrogenated oils, palm oil, coconut oil, palm kernel oil. Solid fats and shortenings, including bacon fat, salt pork, lard, and butter. Nondairy cream substitutes. Salad dressings with cheese or sour cream. Beverages Regular sodas and juice drinks with added sugar. Sweets and desserts Frosting. Pudding. Cookies. Cakes. Pies. Milk chocolate or white chocolate. Buttered syrups. Full-fat ice cream or ice cream drinks. The items listed above may not be a complete list of foods and drinks to  avoid. Contact a dietitian for more information. Summary Heart-healthy meal planning includes eating less unhealthy fats, eating more healthy fats, and making other changes in your diet. Eat a balanced diet. This includes fruits and vegetables, low-fat or nonfat dairy, lean protein, nuts and legumes, whole grains, and heart-healthy oils and fats. This information is not intended to replace advice given to you by your health care provider. Make sure you discuss  any questions you have with your health care provider. Document Revised: 02/04/2021 Document Reviewed: 02/04/2021 Elsevier Patient Education  2024 Elsevier Inc.    If you have been instructed to have an in-person evaluation today at a local Urgent Care facility, please use the link below. It will take you to a list of all of our available [facility], including address, phone number and hours of operation. Please do not delay care.  [facility]  If you or a family member do not have a primary care provider, use the link below to schedule a visit and establish care. When you choose a [facility] primary care physician or advanced practice provider, you gain a long-term partner in health. Find a Primary Care Provider  Learn more about [facility]'s in-office and virtual care options: [facility] - Get Care Now

## 2022-07-10 ENCOUNTER — Other Ambulatory Visit (HOSPITAL_COMMUNITY)
Admission: RE | Admit: 2022-07-10 | Discharge: 2022-07-10 | Disposition: A | Discharge status: Home / Self Care | Payer: BC Managed Care – PPO | Source: Ambulatory Visit | Attending: Internal Medicine | Admitting: Internal Medicine

## 2022-07-10 ENCOUNTER — Ambulatory Visit: Payer: BC Managed Care – PPO | Admitting: Internal Medicine

## 2022-07-10 ENCOUNTER — Encounter: Payer: Self-pay | Admitting: Internal Medicine

## 2022-07-10 VITALS — BP 132/85 | HR 92 | Temp 98.7°F | Ht 66.0 in | Wt 218.4 lb

## 2022-07-10 DIAGNOSIS — Z0000 Encounter for general adult medical examination without abnormal findings: Secondary | ICD-10-CM

## 2022-07-10 DIAGNOSIS — Z119 Encounter for screening for infectious and parasitic diseases, unspecified: Secondary | ICD-10-CM | POA: Diagnosis not present

## 2022-07-10 DIAGNOSIS — Z13220 Encounter for screening for lipoid disorders: Secondary | ICD-10-CM | POA: Diagnosis not present

## 2022-07-10 DIAGNOSIS — Z7251 High risk heterosexual behavior: Secondary | ICD-10-CM

## 2022-07-10 DIAGNOSIS — I10 Essential (primary) hypertension: Secondary | ICD-10-CM | POA: Diagnosis not present

## 2022-07-10 DIAGNOSIS — H833X3 Noise effects on inner ear, bilateral: Secondary | ICD-10-CM

## 2022-07-10 DIAGNOSIS — R0683 Snoring: Secondary | ICD-10-CM | POA: Insufficient documentation

## 2022-07-10 LAB — COMPREHENSIVE METABOLIC PANEL
ALT: 75 U/L — ABNORMAL HIGH (ref 0–53)
AST: 29 U/L (ref 0–37)
Albumin: 5.1 g/dL (ref 3.5–5.2)
Alkaline Phosphatase: 60 U/L (ref 39–117)
BUN: 12 mg/dL (ref 6–23)
CO2: 29 mEq/L (ref 19–32)
Calcium: 10.4 mg/dL (ref 8.4–10.5)
Chloride: 102 mEq/L (ref 96–112)
Creatinine, Ser: 1.15 mg/dL (ref 0.40–1.50)
GFR: 79.87 mL/min (ref >60.00)
Glucose, Bld: 93 mg/dL (ref 70–99)
Potassium: 3.9 mEq/L (ref 3.5–5.1)
Sodium: 140 mEq/L (ref 135–145)
Total Bilirubin: 0.6 mg/dL (ref 0.2–1.2)
Total Protein: 8.2 g/dL (ref 6.0–8.3)

## 2022-07-10 LAB — LIPID PANEL
Cholesterol: 308 mg/dL — ABNORMAL HIGH (ref 0–200)
HDL: 40.6 mg/dL (ref >39.00)
LDL Cholesterol: 244 mg/dL — ABNORMAL HIGH (ref 0–99)
NonHDL: 267.73
Total CHOL/HDL Ratio: 8
Triglycerides: 117 mg/dL (ref 0.0–149.0)
VLDL: 23.4 mg/dL (ref 0.0–40.0)

## 2022-07-10 LAB — CBC WITH DIFFERENTIAL/PLATELET
Basophils Absolute: 0.1 10*3/uL (ref 0.0–0.1)
Basophils Relative: 0.9 % (ref 0.0–3.0)
Eosinophils Absolute: 0.1 10*3/uL (ref 0.0–0.7)
Eosinophils Relative: 0.8 % (ref 0.0–5.0)
HCT: 46.5 % (ref 39.0–52.0)
Hemoglobin: 15.5 g/dL (ref 13.0–17.0)
Lymphocytes Relative: 48 % — ABNORMAL HIGH (ref 12.0–46.0)
Lymphs Abs: 3.6 10*3/uL (ref 0.7–4.0)
MCHC: 33.3 g/dL (ref 30.0–36.0)
MCV: 86.6 fl (ref 78.0–100.0)
Monocytes Absolute: 0.6 10*3/uL (ref 0.1–1.0)
Monocytes Relative: 8.5 % (ref 3.0–12.0)
Neutro Abs: 3.1 10*3/uL (ref 1.4–7.7)
Neutrophils Relative %: 41.8 % — ABNORMAL LOW (ref 43.0–77.0)
Platelets: 276 10*3/uL (ref 150.0–400.0)
RBC: 5.37 Mil/uL (ref 4.22–5.81)
RDW: 13.2 % (ref 11.5–15.5)
WBC: 7.5 10*3/uL (ref 4.0–10.5)

## 2022-07-10 LAB — HEMOGLOBIN A1C: Hgb A1c MFr Bld: 4.6 % (ref 4.6–6.5)

## 2022-07-10 LAB — TSH: TSH: 1.37 u[IU]/mL (ref 0.35–5.50)

## 2022-07-10 MED ORDER — ZEPBOUND 2.5 MG/0.5ML ~~LOC~~ SOAJ: SUBCUTANEOUS | 3 refills | Status: DC

## 2022-07-10 MED ORDER — AMLODIPINE BESYLATE 5 MG PO TABS: 5.0000 mg | ORAL_TABLET | Freq: Every day | ORAL | 4 refills | Status: AC

## 2022-07-10 MED ORDER — ZEPBOUND 2.5 MG/0.5ML ~~LOC~~ SOAJ
SUBCUTANEOUS | 3 refills | Status: DC
Start: 2022-07-10 — End: 2022-07-11

## 2022-07-10 NOTE — Progress Notes (Signed)
[facility]  Phone: [phone]  New patient visit  Visit Date: 07/10/2022 Patient: Jesse Trevino   DOB: 28-Jan-1980   42 y.o. Male  MRN: [id number] Patient Care Team: Lula Olszewski, MD as PCP - General (Internal Medicine) Today's Health Care Provider: Lula Olszewski, MD   Assessment and Plan:    Jesse Trevino was seen today for new patient (initial visit) and annual exam.  Screening examination for infectious disease -     HIV Antibody (routine testing w rflx) -     Hepatitis C antibody  Hypertension, unspecified type Assessment & Plan: Hypertension: Well controlled on Amlodipine. We will continue Amlodipine with refills for 365 days and monitor blood pressure, adjusting medication as needed with weight changes.  Orders: -     amLODIPine Besylate; Take 1 tablet (5 mg total) by mouth daily.  Dispense: 90 tablet; Refill: 4 -     Lipid panel -     TSH -     Comprehensive metabolic panel -     CBC with Differential/Platelet -     Ambulatory referral to Sleep Studies  Morbid obesity (HCC) Overview:     Assessment & Plan: Obesity: He has initiated a low sodium diet and is interested in weight loss medication. We will order Zetbal, a once-weekly injectable that suppresses appetite, encourage regular exercise, continuation of a healthy diet, and consider vitamin supplementation due to potential decreased food intake.  Orders: -     Hemoglobin A1c -     Ambulatory referral to Sleep Studies  Snoring Assessment & Plan: Sleep Apnea: He is at possible risk due to obesity and hypertension, relevant for CDL renewal. We will order a sleep study to evaluate for sleep apnea and discuss potential benefits of treatment if sleep apnea is confirmed.  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    Today's Health Maintenance Counseling and Anticipatory Guidance:  Eye exams:  Every 1-2 years was suggested Dental Health:  Reminded about importance of regular tooth brushing, flossing, and dental visits q6 months Sinus Hygiene:  Recommended daily sinus rinsing with sterile saline mist.  Cardiovascular Risk Factor Reduction:   Advised patient of need for regular exercise and diet rich and fruits and vegetables (high in fiber) and healthy fats (omega-3 unsaturated fats in fish/nuts/avocados/evoo) to reduce risk of heart attack and stroke. Avoid first and 2nd hand smoke and stimulants.   Avoid extreme exercise, only exercise in moderation Nutrition and Weight Management: recommended a diet to achieve a lean physique, in addition to a heart-healthy diet.  Avoid fattening foods with carbohydrates unless resistance training. Wt Readings from Last 3 Encounters:  07/10/22 218 lb 6.4 oz (99.1 kg)  01/03/16 193 lb (87.5 kg)   Body mass index is 35.25 kg/m. /  Substance use:  I discussed my recommendation of total abstinence from all substances of abuse including smoke(including 2nd hand), alcohol, illicit drugs, inhalants, sugar.   Offered to assist with any substance use disorders or addictions.   STD screening: testing offered today, and  he agreed  Sleep Apnea screening:  He  denies any significant problems with sleep quality or hypersomnolence or being advised that he has been told that he snores or has apnea Depression screening:      01/03/2016   10:25 AM  Depression screen PHQ 2/9  Decreased Interest 0  Down, Depressed, Hopeless 0  PHQ - 2 Score 0   Counseled on value of gratitude exercises, mindfulness, physical exercise, social connections, and good sleep Injury prevention:  Discussed safety belts, safety helmets, smoke detectors.  Avoid adventure sports and high impact exercises. Health maintenance and immunizations reviewed and he was encouraged to complete anything that is due: Immunization History  Administered Date(s) Administered   Tdap 09/15/2012   There are no preventive care reminders to display for this patient.  Today's Cancer Screening Guidance: Penile & Testicular cancer screening:  He was advised to palpate his testicles, scrotum, and penis for masses and inform me of any.   Prostate cancer screening:  Denies family history of prostate cancer or hematospermia so too young for screening by current guidelines. No results found for: "PSA"     Colon cancer screening:  Denies strong family history of colon cancer or blood in stool so no screening is indicated until age 60. Thyroid cancer screening:  encouraged to self exam thyroid for nodules every 6-12 months Skin cancer screening:  Advised regular sunscreen use.  Showed him pictures of melanomas for reference.  He denies worrisome, changing, or new skin lesions.   We will order a general health panel to monitor for diabetes, thyroid problems, and bone marrow issues, advise regular dental check-ups every six months, recommend a vision check-up every two years, advise daily sinus rinse to reduce the risk of sinus infections, recommend an annual flu shot around Halloween, order STI screening via urine sample, advise self-checks for skin cancer, particularly under nails and on soles of feet, advise him to report any rectal bleeding for early colon cancer screening, and schedule a follow-up in one year, or sooner if any issues arise.           Clinical Presentation:    Patient affirms intent to establish a primary care provider relationship with Lula Olszewski, MD going forward.  42 y.o. male  with main concerns (chief complaints) today expressed as New Patient (Initial Visit) and Annual Exam  Discussed  the use of AI scribe software for clinical note transcription with the patient, who gave verbal consent to proceed.  History of Present Illness   The patient, born on 1980/04/09, presents for a new patient annual exam. He has recently been diagnosed with hypertension and started on amlodipine since June 4th, which appears to be effectively controlling his blood pressure. He reports no current use of naproxen and tramadol. The patient has a high BMI, indicating overweight status, and expresses interest in addressing this issue. He has recently adopted a healthier diet, including grilled chicken, grilled salmon, and salads without dressing. He also reports some physical activity, specifically cycling.  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 He also has a history of smoking cigars. He denies any current substance abuse problems.  He reports no current symptoms of sleep apnea, such as daytime drowsiness or observed cessation of breathing during sleep.  He denies any current issues with his testicles or penis, and no lumps were found on self-examination. He also denies any current issues  with his thyroid. He reports no current symptoms of colon cancer, such as rectal bleeding, and opts to wait for insurance coverage for colon cancer screening.  He reports no current symptoms of skin cancer and is advised to use sunblock due to his risk of skin cancer. He reports no current symptoms of cardiovascular disease but is advised to maintain his healthy lifestyle due to his risk factors of high blood pressure and overweight status.  He reports no current symptoms of any sexually transmitted infections but agrees to screening. He reports no current symptoms of any dental issues but is advised to maintain regular dental check-ups due to a potential side effect of amlodipine. He reports no current symptoms of any vision issues but is advised to  have regular vision exams.  He reports no current symptoms of any allergies but is advised to use a daily sinus rinse to reduce the risk of sinus infections. He reports no current symptoms of any bone marrow problems, diabetes, or thyroid problems, but lab work is ordered for screening and monitoring purposes.        Comprehensive chart development today: Problems:has Morbid obesity (HCC); Hypertension; Snoring; and Noise-induced hearing loss of both ears on their problem list. Current Meds  Medication Sig   [DISCONTINUED] tirzepatide (ZEPBOUND) 2.5 MG/0.5ML Pen Inject 2.5 mg into the skin once a week for 28 days, THEN 5 mg once a week for 28 days, THEN 7.5 mg once a week for 28 days, THEN 10 mg once a week for 28 days, THEN 12.5 mg once a week for 28 days, THEN 15 mg once a week for 28 days. Body mass index is 35.25 kg/m.Marland Kitchen Patient is not diabetic. If not covered, check for savings card online..   Allergies:  reports that he does not currently use alcohol. Past Medical History  has a past medical history of Dental caries and Hypertension. Past Surgical History  has a past surgical history that includes Hernia repair. Social History  reports that he has never smoked. He has never used smokeless tobacco. He reports that he does not currently use alcohol. He reports that he does not use drugs. Family History family history includes Diabetes in his brother, father, and another family member; Hypertension in an other family member.  Depression Screen and Health Maintenance:    01/03/2016   10:25 AM  PHQ 2/9 Scores  PHQ - 2 Score 0   Health Maintenance  Topic Date Due   INFLUENZA VACCINE  08/14/2022   DTaP/Tdap/Td (2 - Td or Tdap) 09/16/2022   Hepatitis C Screening  Completed   HIV Screening  Completed   HPV VACCINES  Aged Out   COVID-19 Vaccine  Discontinued   Immunization History  Administered Date(s) Administered   Tdap 09/15/2012   Review of Systems  Constitutional:  Negative  for chills, diaphoresis, fever, malaise/fatigue and weight loss.  HENT:  Negative for congestion, ear discharge, ear pain, hearing loss, nosebleeds, sinus pain, sore throat and tinnitus.   Eyes:  Negative for blurred vision, double vision, photophobia, pain, discharge and redness.  Respiratory:  Negative for cough, hemoptysis, sputum production, shortness of breath, wheezing and stridor.   Cardiovascular:  Negative for chest pain, palpitations, orthopnea, claudication, leg swelling and PND.  Gastrointestinal:  Negative for abdominal pain, blood in stool, constipation, diarrhea, heartburn, melena, nausea and vomiting.  Genitourinary:  Negative for dysuria, flank pain, frequency, hematuria and urgency.  Musculoskeletal:  Negative for back pain, falls, joint pain, myalgias and neck pain.  Skin:  Negative for itching and rash.  Neurological:  Negative for dizziness, tingling, tremors, sensory change, speech change, focal weakness, seizures, loss of consciousness, weakness and headaches.  Endo/Heme/Allergies:  Negative for environmental allergies and polydipsia. Does not bruise/bleed easily.  Psychiatric/Behavioral:  Negative for depression, hallucinations, memory loss, substance abuse and suicidal ideas. The patient is not nervous/anxious and does not have insomnia.         Objective:     Physical Exam: BP 132/85 (BP Location: Left Arm, Patient Position: Sitting)   Pulse 92   Temp 98.7 F (37.1 C) (Temporal)   Ht 5\' 6"  (1.676 m)   Wt 218 lb 6.4 oz (99.1 kg)   SpO2 98%   BMI 35.25 kg/m   Body mass index is 35.25 kg/m. Wt Readings from Last 3 Encounters:  07/10/22 218 lb 6.4 oz (99.1 kg)  01/03/16 193 lb (87.5 kg)   GEN: NAD, resting comfortably  HEENT: Eardrums scarred. Mucosa of mouth and cheek with damage from biting. Oropharynx clear. No thyromegaly noted.Marland Kitchen No palpable lymphadenopathy or thyroid nodules. CARDIOVASCULAR: S1 and S2 heart sounds have regular rate and rhythm with no  murmurs appreciated PULMONARY:  Normal work of breathing. Clear to auscultation bilaterally with no crackles, wheezes, or rhonchi ABDOMEN: Soft, Nontender, Nondistended.  MSK: No edema, cyanosis, or clubbing noted  SKIN: Warm, dry, no lesions of concern observed No evidence of skin cancer.  NEURO: CN2-12 grossly intact. Strength 5/5 in upper and lower extremities. Reflexes symmetric and intact bilaterally.  PSYCH: Normal affect and thought content, pleasant and cooperative.         Reviewed Results    Results for orders placed or performed in visit on 07/10/22  HIV antibody (with reflex)  Result Value Ref Range   HIV 1&2 Ab, 4th Generation NON-REACTIVE NON-REACTIVE  Hepatitis C Antibody  Result Value Ref Range   Hepatitis C Ab NON-REACTIVE NON-REACTIVE  Lipid panel  Result Value Ref Range   Cholesterol 308 (H) 0 - 200 mg/dL   Triglycerides 161.0 0.0 - 149.0 mg/dL   HDL 96.04 >54.09 mg/dL   VLDL 81.1 0.0 - 91.4 mg/dL   LDL Cholesterol 782 (H) 0 - 99 mg/dL   Total CHOL/HDL Ratio 8    NonHDL 267.73   TSH  Result Value Ref Range   TSH 1.37 0.35 - 5.50 uIU/mL  Comprehensive metabolic panel  Result Value Ref Range   Sodium 140 135 - 145 mEq/L   Potassium 3.9 3.5 - 5.1 mEq/L   Chloride 102 96 - 112 mEq/L   CO2 29 19 - 32 mEq/L   Glucose, Bld 93 70 - 99 mg/dL   BUN 12 6 - 23 mg/dL   Creatinine, Ser 9.56 0.40 - 1.50 mg/dL   Total Bilirubin 0.6 0.2 - 1.2 mg/dL   Alkaline Phosphatase 60 39 - 117 U/L   AST 29 0 - 37 U/L   ALT 75 (H) 0 - 53 U/L   Total Protein 8.2 6.0 - 8.3 g/dL   Albumin 5.1 3.5 - 5.2 g/dL   GFR 21.30 >86.57 mL/min   Calcium 10.4 8.4 - 10.5 mg/dL  CBC with Differential/Platelet  Result Value Ref Range   WBC 7.5 4.0 - 10.5 K/uL   RBC 5.37 4.22 - 5.81 Mil/uL   Hemoglobin 15.5 13.0 - 17.0 g/dL   HCT 84.6 96.2 - 95.2 %   MCV 86.6 78.0 - 100.0 fl   MCHC 33.3 30.0 - 36.0 g/dL   RDW 84.1 32.4 - 40.1 %  Platelets 276.0 150.0 - 400.0 K/uL   Neutrophils  Relative % 41.8 (L) 43.0 - 77.0 %   Lymphocytes Relative 48.0 (H) 12.0 - 46.0 %   Monocytes Relative 8.5 3.0 - 12.0 %   Eosinophils Relative 0.8 0.0 - 5.0 %   Basophils Relative 0.9 0.0 - 3.0 %   Neutro Abs 3.1 1.4 - 7.7 K/uL   Lymphs Abs 3.6 0.7 - 4.0 K/uL   Monocytes Absolute 0.6 0.1 - 1.0 K/uL   Eosinophils Absolute 0.1 0.0 - 0.7 K/uL   Basophils Absolute 0.1 0.0 - 0.1 K/uL  HgB A1c  Result Value Ref Range   Hgb A1c MFr Bld 4.6 4.6 - 6.5 %  Urine cytology ancillary only([place])  Result Value Ref Range   Neisseria Gonorrhea Negative    Chlamydia Negative    Trichomonas Negative    Comment Normal Reference Range Trichomonas - Negative    Comment Normal Reference Ranger Chlamydia - Negative    Comment      Normal Reference Range Neisseria Gonorrhea - Negative    Results for orders placed or performed in visit on 07/10/22  HIV antibody (with reflex)  Result Value Ref Range   HIV 1&2 Ab, 4th Generation NON-REACTIVE NON-REACTIVE  Hepatitis C Antibody  Result Value Ref Range   Hepatitis C Ab NON-REACTIVE NON-REACTIVE  Lipid panel  Result Value Ref Range   Cholesterol 308 (H) 0 - 200 mg/dL   Triglycerides 161.0 0.0 - 149.0 mg/dL   HDL 96.04 >54.09 mg/dL   VLDL 81.1 0.0 - 91.4 mg/dL   LDL Cholesterol 782 (H) 0 - 99 mg/dL   Total CHOL/HDL Ratio 8    NonHDL 267.73   TSH  Result Value Ref Range   TSH 1.37 0.35 - 5.50 uIU/mL  Comprehensive metabolic panel  Result Value Ref Range   Sodium 140 135 - 145 mEq/L   Potassium 3.9 3.5 - 5.1 mEq/L   Chloride 102 96 - 112 mEq/L   CO2 29 19 - 32 mEq/L   Glucose, Bld 93 70 - 99 mg/dL   BUN 12 6 - 23 mg/dL   Creatinine, Ser 9.56 0.40 - 1.50 mg/dL   Total Bilirubin 0.6 0.2 - 1.2 mg/dL   Alkaline Phosphatase 60 39 - 117 U/L   AST 29 0 - 37 U/L   ALT 75 (H) 0 - 53 U/L   Total Protein 8.2 6.0 - 8.3 g/dL   Albumin 5.1 3.5 - 5.2 g/dL   GFR 21.30 >86.57 mL/min   Calcium 10.4 8.4 - 10.5 mg/dL  CBC with Differential/Platelet  Result  Value Ref Range   WBC 7.5 4.0 - 10.5 K/uL   RBC 5.37 4.22 - 5.81 Mil/uL   Hemoglobin 15.5 13.0 - 17.0 g/dL   HCT 84.6 96.2 - 95.2 %   MCV 86.6 78.0 - 100.0 fl   MCHC 33.3 30.0 - 36.0 g/dL   RDW 84.1 32.4 - 40.1 %   Platelets 276.0 150.0 - 400.0 K/uL   Neutrophils Relative % 41.8 (L) 43.0 - 77.0 %   Lymphocytes Relative 48.0 (H) 12.0 - 46.0 %   Monocytes Relative 8.5 3.0 - 12.0 %   Eosinophils Relative 0.8 0.0 - 5.0 %   Basophils Relative 0.9 0.0 - 3.0 %   Neutro Abs 3.1 1.4 - 7.7 K/uL   Lymphs Abs 3.6 0.7 - 4.0 K/uL   Monocytes Absolute 0.6 0.1 - 1.0 K/uL   Eosinophils Absolute 0.1 0.0 - 0.7 K/uL   Basophils Absolute  0.1 0.0 - 0.1 K/uL  HgB A1c  Result Value Ref Range   Hgb A1c MFr Bld 4.6 4.6 - 6.5 %  Urine cytology ancillary only([place])  Result Value Ref Range   Neisseria Gonorrhea Negative    Chlamydia Negative    Trichomonas Negative    Comment Normal Reference Range Trichomonas - Negative    Comment Normal Reference Ranger Chlamydia - Negative    Comment      Normal Reference Range Neisseria Gonorrhea - Negative    Recent Results (from the past 2160 hour(s))  HIV antibody (with reflex)     Status: None   Collection Time: 07/10/22 11:28 AM  Result Value Ref Range   HIV 1&2 Ab, 4th Generation NON-REACTIVE NON-REACTIVE    Comment: HIV-1 antigen and HIV-1/HIV-2 antibodies were not detected. There is no laboratory evidence of HIV infection. Marland Kitchen PLEASE NOTE: This information has been disclosed to you from records whose confidentiality may be protected by state law.  If your state requires such protection, then the state law prohibits you from making any further disclosure of the information without the specific written consent of the person to whom it pertains, or as otherwise permitted by law. A general authorization for the release of medical or other information is NOT sufficient for this purpose. . For additional information please refer  to http://education.[facility].com/faq/FAQ106 (This link is being provided for informational/ educational purposes only.) . Marland Kitchen The performance of this assay has not been clinically validated in patients less than 60 years old. .   Hepatitis C Antibody     Status: None   Collection Time: 07/10/22 11:28 AM  Result Value Ref Range   Hepatitis C Ab NON-REACTIVE NON-REACTIVE    Comment: . HCV antibody was non-reactive. There is no laboratory  evidence of HCV infection. . In most cases, no further action is required. However, if recent HCV exposure is suspected, a test for HCV RNA (test code 40981) is suggested. . For additional information please refer to http://education.[facility].com/faq/FAQ22v1 (This link is being provided for informational/ educational purposes only.) .   Lipid panel     Status: Abnormal   Collection Time: 07/10/22 11:28 AM  Result Value Ref Range   Cholesterol 308 (H) 0 - 200 mg/dL    Comment: ATP III Classification       Desirable:  < 200 mg/dL               Borderline High:  200 - 239 mg/dL          High:  > = 191 mg/dL   Triglycerides 478.2 0.0 - 149.0 mg/dL    Comment: Normal:  <956 mg/dLBorderline High:  150 - 199 mg/dL   HDL 21.30 >86.57 mg/dL   VLDL 84.6 0.0 - 96.2 mg/dL   LDL Cholesterol 952 (H) 0 - 99 mg/dL   Total CHOL/HDL Ratio 8     Comment:                Men          Women1/2 Average Risk     3.4          3.3Average Risk          5.0          4.42X Average Risk          9.6          7.13X Average Risk          15.0  11.0                       NonHDL 267.73     Comment: NOTE:  Non-HDL goal should be 30 mg/dL higher than patient's LDL goal (i.e. LDL goal of < 70 mg/dL, would have non-HDL goal of < 100 mg/dL)  TSH     Status: None   Collection Time: 07/10/22 11:28 AM  Result Value Ref Range   TSH 1.37 0.35 - 5.50 uIU/mL  Comprehensive metabolic panel     Status: Abnormal   Collection Time: 07/10/22 11:28 AM  Result Value  Ref Range   Sodium 140 135 - 145 mEq/L   Potassium 3.9 3.5 - 5.1 mEq/L   Chloride 102 96 - 112 mEq/L   CO2 29 19 - 32 mEq/L   Glucose, Bld 93 70 - 99 mg/dL   BUN 12 6 - 23 mg/dL   Creatinine, Ser 1.61 0.40 - 1.50 mg/dL   Total Bilirubin 0.6 0.2 - 1.2 mg/dL   Alkaline Phosphatase 60 39 - 117 U/L   AST 29 0 - 37 U/L   ALT 75 (H) 0 - 53 U/L   Total Protein 8.2 6.0 - 8.3 g/dL   Albumin 5.1 3.5 - 5.2 g/dL   GFR 09.60 >45.40 mL/min    Comment: Calculated using the CKD-EPI Creatinine Equation (2021)   Calcium 10.4 8.4 - 10.5 mg/dL  CBC with Differential/Platelet     Status: Abnormal   Collection Time: 07/10/22 11:28 AM  Result Value Ref Range   WBC 7.5 4.0 - 10.5 K/uL   RBC 5.37 4.22 - 5.81 Mil/uL   Hemoglobin 15.5 13.0 - 17.0 g/dL   HCT 98.1 19.1 - 47.8 %   MCV 86.6 78.0 - 100.0 fl   MCHC 33.3 30.0 - 36.0 g/dL   RDW 29.5 62.1 - 30.8 %   Platelets 276.0 150.0 - 400.0 K/uL   Neutrophils Relative % 41.8 (L) 43.0 - 77.0 %   Lymphocytes Relative 48.0 (H) 12.0 - 46.0 %   Monocytes Relative 8.5 3.0 - 12.0 %   Eosinophils Relative 0.8 0.0 - 5.0 %   Basophils Relative 0.9 0.0 - 3.0 %   Neutro Abs 3.1 1.4 - 7.7 K/uL   Lymphs Abs 3.6 0.7 - 4.0 K/uL   Monocytes Absolute 0.6 0.1 - 1.0 K/uL   Eosinophils Absolute 0.1 0.0 - 0.7 K/uL   Basophils Absolute 0.1 0.0 - 0.1 K/uL  HgB A1c     Status: None   Collection Time: 07/10/22 11:28 AM  Result Value Ref Range   Hgb A1c MFr Bld 4.6 4.6 - 6.5 %    Comment: Glycemic Control Guidelines for People with Diabetes:Non Diabetic:  <6%Goal of Therapy: <7%Additional Action Suggested:  >8%   Urine cytology ancillary only([place])     Status: None   Collection Time: 07/10/22 12:00 PM  Result Value Ref Range   Neisseria Gonorrhea Negative    Chlamydia Negative    Trichomonas Negative    Comment Normal Reference Range Trichomonas - Negative    Comment Normal Reference Ranger Chlamydia - Negative    Comment      Normal Reference Range Neisseria  Gonorrhea - Negative       Additional notes: Initial Appointment Goals:  This initial visit focused on establishing a foundation for the patient's care. We collaboratively reviewed his medical history and medications in detail, updating the chart as shown in the encounter. Given the extensive information, we  prioritized addressing his most pressing concerns, which he reported were: New Patient (Initial Visit) and Annual Exam  While the complexity of the patient's medical picture may necessitate further evaluation in subsequent visits, we were able to develop a preliminary care plan together. To expedite a comprehensive plan at the next visit, we encouraged the patient to gather relevant medical records from previous providers. This collaborative approach will ensure a more complete understanding of the patient's health and inform the development of a personalized care plan. We look forward to continuing the conversation and working together with the patient on achieving his health goals.   Collaborative Documentation:  Today's encounter utilized real-time, dynamic patient engagement.  Patients actively participate by directly reviewing and assisting in updating their medical records through a shared screen. This transparency empowers patients to visually confirm chart updates made by the healthcare provider.  This collaborative approach facilitates problem management as we jointly update the problem list, problem overview, and assessment/plan. Ultimately, this process enhances chart accuracy and completeness, fostering shared decision-making, patient education, and informed consent for tests and treatments.  Collaborative Treatment Planning:  Treatment plans were discussed and reviewed in detail.  Explained medication safety and potential side effects.  Encouraged participation and answered all patient questions, confirming understanding and comfort with the plan. Encouraged patient to contact our office  if they have any questions or concerns. Agreed on patient returning to office if symptoms worsen, persist, or new symptoms develop. Discussed precautions in case of needing to visit the Emergency Department.  ----------------------------------------------------- Lula Olszewski, MD  07/11/2022 3:28 PM  [facility]:  [phone]

## 2022-07-11 ENCOUNTER — Other Ambulatory Visit: Payer: Self-pay

## 2022-07-11 DIAGNOSIS — H833X3 Noise effects on inner ear, bilateral: Secondary | ICD-10-CM | POA: Insufficient documentation

## 2022-07-11 LAB — HSV(HERPES SIMPLEX VRS) I + II AB-IGG
HAV 1 IGG,TYPE SPECIFIC AB: <0.9 index
HSV 2 IGG,TYPE SPECIFIC AB: 4.56 index — ABNORMAL HIGH

## 2022-07-11 LAB — URINE CYTOLOGY ANCILLARY ONLY
Chlamydia: NEGATIVE
Comment: NEGATIVE
Comment: NEGATIVE
Comment: NORMAL
Neisseria Gonorrhea: NEGATIVE
Trichomonas: NEGATIVE

## 2022-07-11 LAB — SYPHILIS: RPR W/REFLEX TO RPR TITER AND TREPONEMAL ANTIBODIES, TRADITIONAL SCREENING AND DIAGNOSIS ALGORITHM: RPR Ser Ql: NONREACTIVE

## 2022-07-11 LAB — HIV ANTIBODY (ROUTINE TESTING W REFLEX): HIV 1&2 Ab, 4th Generation: NONREACTIVE

## 2022-07-11 LAB — HEPATITIS C ANTIBODY: Hepatitis C Ab: NONREACTIVE

## 2022-07-11 MED ORDER — ZEPBOUND 2.5 MG/0.5ML ~~LOC~~ SOAJ
SUBCUTANEOUS | 3 refills | Status: DC
Start: 2022-07-11 — End: 2022-08-26

## 2022-07-11 NOTE — Assessment & Plan Note (Signed)
Hypertension: Well controlled on Amlodipine. We will continue Amlodipine with refills for 365 days and monitor blood pressure, adjusting medication as needed with weight changes.

## 2022-07-11 NOTE — Assessment & Plan Note (Signed)
Sleep Apnea: He is at possible risk due to obesity and hypertension, relevant for CDL renewal. We will order a sleep study to evaluate for sleep apnea and discuss potential benefits of treatment if sleep apnea is confirmed.

## 2022-07-11 NOTE — Patient Instructions (Signed)
Welcome aboard!   Today's visit was a valuable first step in understanding your health and starting your personalized care journey. We discussed your medical history and medications in detail. Given the extensive information, we prioritized addressing your most pressing concerns.  We understood those concerns to be:  New Patient (Initial Visit) and Annual Exam  VISIT SUMMARY:  During your recent visit, we discussed your hypertension, which is well controlled with amlodipine, and your interest in addressing your high BMI. We also discussed your history of loud noise exposure and the potential risk of sleep apnea due to your obesity and hypertension. We have planned several steps to manage these issues and maintain your overall health.  YOUR PLAN:  -HYPERTENSION: Your high blood pressure is well controlled with amlodipine. We will continue this medication and monitor your blood pressure, adjusting the medication as needed with weight changes.  -OBESITY: You've started a low sodium diet and expressed interest in weight loss medication. We will order Zetbal, a medication that helps control your appetite, and encourage you to continue regular exercise and a healthy diet. We may also consider vitamin supplementation due to potential decreased food intake.  -POTENTIAL SLEEP APNEA: Due to your obesity and hypertension, you may be at risk for sleep apnea. We will order a sleep study to evaluate this and discuss potential treatments if sleep apnea is confirmed.  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  INSTRUCTIONS:  We have ordered a general health panel to monitor for diabetes, thyroid problems, and bone marrow issues. We advise regular dental check-ups every six months and a vision check-up every two years. We recommend a daily sinus rinse to reduce  the risk of sinus infections and an annual flu shot around Halloween. We have also ordered STI screening via a urine sample. Please regularly check your skin for signs of skin cancer, particularly under your nails and on the soles of your feet. Report any rectal bleeding for early colon cancer screening. We will schedule a follow-up in one year, or sooner if any issues arise.    Building a Complete Picture  To create the most effective care plan possible, we may need additional information from previous providers. We encouraged you to gather any relevant medical records for your next visit. This will help [place] build a more complete picture and develop a personalized plan together. In the meantime, we'll address your immediate concerns and provide resources to help you manage all of your medical issues.  We encourage you to use MyChart to review these efforts, and to help [place] find and correct any omissions or errors in your medical chart.  Managing Your Health Over Time  Managing every aspect of your health in a single visit isn't always feasible, but that's okay.  We addressed your most pressing concerns today and charted a course for future care. Acute conditions or preventive care measures may require further attention.  We encourage you to schedule a follow-up visit at your earliest convenience to discuss any unresolved issues.  We strongly encourage participation in annual preventive care visits to help [place] develop a more thorough understanding of your health and to help you maintain optimal wellness - please inquire about scheduling your next one with [place] at your earliest convenience.  Your Satisfaction Matters  It was a pleasure seeing you today!  Your  health and satisfaction will always be my top priorities. If you believe your experience today was worthy of a 5-star rating, I'd be grateful for your feedback!  Lula Olszewski, MD  Next Steps  Schedule Follow-Up:  We recommend a follow-up  appointment in 1 year for your next wellness visit.  If you develop any new problems, want to address any medical issues, or your condition worsens before then, please call us for an appointment or seek emergency care. Preventive Care:  Don't forget to schedule your annual preventive care visit!  Please review your attached preventive care information. Make sure to arrange appointments for dental and vision routine screening, and use nightly nasal saline mist to keep your sinuses clear. Medical Information Release:  For any relevant medical information we don't have, please sign a release form at the front desk so we can obtain it for your records. Lab & X-ray Appointments:  Scheduled any incomplete lab tests today or call us to schedule.  X-Rays can be done without an appointment at [facility] at [facility] ([address]), M-F 8:30am-noon or 1pm-5pm.  Just tell them you're there for X-rays ordered by Dr. Jon Billings.  We'll receive the results and contact you by phone or MyChart to discuss next steps.  Making the Most of Our Focused (20 minute) Appointments:  [x]   Clearly state your top concerns at the beginning of the visit to focus our discussion [x]   If you anticipate you will need more time, please inform the front desk during scheduling - we can book multiple appointments in the same week. [x]   If you have transportation problems- use our convenient video appointments or ask about transportation support. [x]   We can get down to business faster if you use MyChart to update information before the visit and submit non-urgent questions before your visit. Thank you for taking the time to provide details through MyChart.  Let our nurse know and she can import this information into your encounter documents.  Arrival and Wait Times: [x]   Arriving on time ensures that everyone receives prompt attention. [x]   Early morning (8a) and afternoon (1p) appointments tend to have shortest wait  times. [x]   Unfortunately, we cannot delay appointments for late arrivals or hold slots during phone calls.  Thank you for collaborating with [place] to prioritize your health. We look forward to serving you!  Bring to Your Next Appointment  Medications: Please bring all your medication bottles to your next appointment to ensure we have an accurate record of your prescriptions. Health Diaries: If you're monitoring any health conditions at home, keeping a diary of your readings can be very helpful for discussions at your next appointment.  Reviewing Your Records  Please Review this early draft of your clinical notes below and the final encounter summary tomorrow on MyChart after its been completed.   Screening examination for infectious disease -     HIV Antibody (routine testing w rflx) -     Hepatitis C antibody  Hypertension, unspecified type -     amLODIPine Besylate; Take 1 tablet (5 mg total) by mouth daily.  Dispense: 90 tablet; Refill: 4 -     Lipid panel -     TSH -     Comprehensive metabolic panel -     CBC with Differential/Platelet -     Ambulatory referral to Sleep Studies  Morbid obesity (HCC) -     Hemoglobin A1c -     Ambulatory referral to  Sleep Studies -     Zepbound; Inject 2.5 mg into the skin once a week for 28 days, THEN 5 mg once a week for 28 days, THEN 7.5 mg once a week for 28 days, THEN 10 mg once a week for 28 days, THEN 12.5 mg once a week for 28 days, THEN 15 mg once a week for 28 days. Body mass index is 35.25 kg/m.Jesse Trevino Patient is not diabetic. If not covered, check for savings card online..  Dispense: 0.5 mL; Refill: 3  Snoring -     Ambulatory referral to Sleep Studies  High risk sexual behavior, unspecified type -     RPR -     HSV(herpes simplex vrs) 1+2 ab-IgG -     Urine cytology ancillary only     Getting Answers and Following Up  Simple Questions & Concerns: For quick questions or basic follow-up after your visit, reach [place] at [phone] or  MyChart messaging. Complex Concerns: If your concern is more complex, scheduling an appointment might be best. Discuss this with the staff to find the most suitable option. Lab & Imaging Results: We'll contact you directly if results are abnormal or you don't use MyChart. Most normal results will be on MyChart within 2-3 business days, with a review message from Dr. Jon Billings. Haven't heard back in 2 weeks? Need results sooner? Contact us at [phone]. Referrals: Our referral coordinator will manage specialist referrals. The specialist's office should contact you within 2 weeks to schedule an appointment. Call us if you haven't heard from them after 2 weeks.  Staying Connected  MyChart: Activate your MyChart for the fastest way to access results and message [place]. See the last page of this paperwork for instructions on how to activate.  Billing  X-ray & Lab Orders: These are billed by separate companies. Contact the invoicing company directly for questions or concerns. Visit Charges: Discuss any billing inquiries with our administrative services team.  Feedback & Satisfaction  Share Your Experience: We strive for your satisfaction! If you have any complaints, or preferably compliments, please let Dr. Jon Billings know directly or contact our Practice Administrators, Edwena Felty or [employer], by asking at the front desk.   Scheduling Tips  Shorter Wait Times: 8 am and 1 pm appointments often have the quickest wait times. Longer Appointments: If you need more time during your visit, talk to the front desk. Due to insurance regulations, multiple back-to-back appointments might be necessary.

## 2022-07-11 NOTE — Assessment & Plan Note (Signed)
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

## 2022-07-11 NOTE — Assessment & Plan Note (Signed)
Obesity: He has initiated a low sodium diet and is interested in weight loss medication. We will order Zetbal, a once-weekly injectable that suppresses appetite, encourage regular exercise, continuation of a healthy diet, and consider vitamin supplementation due to potential decreased food intake.

## 2022-07-15 ENCOUNTER — Telehealth: Payer: Self-pay

## 2022-07-15 ENCOUNTER — Other Ambulatory Visit (HOSPITAL_COMMUNITY): Payer: Self-pay

## 2022-07-15 NOTE — Telephone Encounter (Signed)
Pharmacy Patient Advocate Encounter   Received notification from Fax that prior authorization for Zepbound 2.5mg /0.64ml is required/requested.   Insurance verification completed.   The patient is insured through [employer] .   Per test claim:

## 2022-07-15 NOTE — Telephone Encounter (Signed)
Sent my chart message informing patient of this information.

## 2022-08-21 ENCOUNTER — Institutional Professional Consult (permissible substitution): Payer: BC Managed Care – PPO | Admitting: Neurology

## 2022-08-26 ENCOUNTER — Encounter: Payer: Self-pay | Admitting: Neurology

## 2022-08-26 ENCOUNTER — Ambulatory Visit (INDEPENDENT_AMBULATORY_CARE_PROVIDER_SITE_OTHER): Payer: BC Managed Care – PPO | Admitting: Neurology

## 2022-08-26 VITALS — BP 129/77 | HR 89 | Ht 68.0 in | Wt 218.0 lb

## 2022-08-26 DIAGNOSIS — E669 Obesity, unspecified: Secondary | ICD-10-CM | POA: Diagnosis not present

## 2022-08-26 DIAGNOSIS — Z9189 Other specified personal risk factors, not elsewhere classified: Secondary | ICD-10-CM | POA: Diagnosis not present

## 2022-08-26 DIAGNOSIS — R0683 Snoring: Secondary | ICD-10-CM

## 2022-08-26 NOTE — Patient Instructions (Signed)

## 2022-08-26 NOTE — Progress Notes (Signed)
Subjective:    Patient ID: Jesse Trevino is a 42 y.o. male.  HPI    Huston Foley, MD, PhD [facility] [address], [address] [address] [place], [place] [address]  Dear Dr. Jon Billings,  I saw your patient, Jesse Trevino, upon your kind request in my sleep clinic today for initial consultation of his sleep disorder, in particular, concern for underlying obstructive sleep apnea.  The patient is unaccompanied today.  As you know, Jesse Trevino is a 42 year old male with an underlying medical history of hypertension, and obesity, who reports snoring and a recent diagnosis of hypertension, he was started on amlodipine.  He denies any significant daytime somnolence.  He is not aware of any family history of sleep apnea.  He was prescribed Zepbound but has not started it yet.  His Epworth sleepiness score is 0 out of 24, fatigue severity score is 9 out of 63.  I reviewed your office note from 07/10/2022. He lives in his wife and 2 children ages 60 and 79, they have 2 dogs in the household, the dogs do not sleep in the bedroom with them.  He does have a TV in the bedroom but it is not on at night.  He denies nightly nocturia or recurrent nocturnal or morning headaches.  He is a non-smoker of cigarettes but smokes a cigar occasionally.  He drinks alcohol occasionally, no daily caffeine.  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  Bedtime is between 8 and 8:30 PM and rise time around 6:30 AM.  His Past Medical History Is Significant For: Past Medical History:  Diagnosis Date   Dental caries    Hypertension     His Past Surgical History Is Significant For: Past Surgical History:  Procedure Laterality Date   HERNIA REPAIR      His Family History Is Significant For: Family History  Problem Relation Age of Onset   Diabetes Father    Diabetes Brother    Diabetes Other    Hypertension Other     His Social History Is Significant  For: Social History   Socioeconomic History   Marital status: Single    Spouse name: Not on file   Number of children: Not on file   Years of education: Not on file   Highest education level: Not on file  Occupational History   Not on file  Tobacco Use   Smoking status: Never   Smokeless tobacco: Never  Vaping Use   Vaping status: Never Used  Substance and Sexual Activity   Alcohol use: Not Currently    Comment: socially   Drug use: Never   Sexual activity: Yes  Other Topics Concern   Not on file  Social History Narrative   Not on file   Social Determinants of Health   Financial Resource Strain: Not on file  Food Insecurity: Not on file  Transportation Needs: Not on file  Physical Activity: Not on file  Stress: Not on file  Social Connections: Unknown (05/17/2021)   [facility] from [facility]   Social Network    Social Network: Not on file    His Allergies Are:  No Known Allergies:   His Current Medications Are:  Outpatient Encounter Medications as of 08/26/2022  Medication Sig   amLODipine (NORVASC) 5 MG tablet Take 1 tablet (5 mg total) by mouth daily.   [DISCONTINUED] tirzepatide (ZEPBOUND) 2.5 MG/0.5ML Pen Inject 2.5 mg into the skin  once a week for 28 days, THEN 5 mg once a week for 28 days, THEN 7.5 mg once a week for 28 days, THEN 10 mg once a week for 28 days, THEN 12.5 mg once a week for 28 days, THEN 15 mg once a week for 28 days. Body mass index is 35.25 kg/m .Marland Kitchen Patient is not diabetic. If not covered, check for savings card online.. (Patient not taking: Reported on 08/26/2022)   No facility-administered encounter medications on file as of 08/26/2022.  :   Review of Systems:  Out of a complete 14 point review of systems, all are reviewed and negative with the exception of these symptoms as listed below:  Review of Systems  Neurological:        Rm 5 alone  Pt is well, reports he doesn't have any sleep concerns. He gets about 8-10 hrs of sleep a night.  No daytime fatigue. Here due to hypertension    Objective:  Neurological Exam  Physical Exam Physical Examination:   Vitals:   08/26/22 0857  BP: 129/77  Pulse: 89    General Examination: The patient is a very pleasant 42 y.o. male in no acute distress. He appears well-developed and well-nourished and well groomed.   HEENT: Normocephalic, atraumatic, pupils are equal, round and reactive to light, extraocular tracking is good without limitation to gaze excursion or nystagmus noted. Hearing is grossly intact. Face is symmetric with normal facial animation. Speech is clear with no dysarthria noted. There is no hypophonia. There is no lip, neck/head, jaw or voice tremor. Neck is supple with full range of passive and active motion. There are no carotid bruits on auscultation. Oropharynx exam reveals: mild mouth dryness, adequate dental hygiene and moderate to marked airway crowding, due to larger uvula, tonsillar size of about 1-2+ bilaterally.  Mallampati class III.  Neck circumference 17 inches, minimal overbite noted.  Tongue protrudes centrally and palate elevates symmetrically.  Chest: Clear to auscultation without wheezing, rhonchi or crackles noted.  Heart: S1+S2+0, regular and normal without murmurs, rubs or gallops noted.   Abdomen: Soft, non-tender and non-distended.  Extremities: There is no pitting edema in the distal lower extremities bilaterally.   Skin: Warm and dry without trophic changes noted.   Musculoskeletal: exam reveals no obvious joint deformities.   Neurologically:  Mental status: The patient is awake, alert and oriented in all 4 spheres. His immediate and remote memory, attention, language skills and fund of knowledge are appropriate. There is no evidence of aphasia, agnosia, apraxia or anomia. Speech is clear with normal prosody and enunciation. Thought process is linear. Mood is normal and affect is normal.  Cranial nerves II - XII are as described above under  HEENT exam.  Motor exam: Normal bulk, strength and tone is noted. There is no obvious action or resting tremor.  Fine motor skills and coordination: grossly intact.  Cerebellar testing: No dysmetria or intention tremor. There is no truncal or gait ataxia.  Sensory exam: intact to light touch in the upper and lower extremities.  Gait, station and balance: He stands easily. No veering to one side is noted. No leaning to one side is noted. Posture is age-appropriate and stance is narrow based. Gait shows normal stride length and normal pace. No problems turning are noted.   Assessment and Plan:  In summary, Jesse Trevino is a very pleasant 42 y.o.-year old male with an underlying medical history of hypertension, and obesity, whose history and physical exam are concerning  for sleep disordered breathing, particularly obstructive sleep apnea (OSA). While a laboratory attended sleep study is typically considered "gold standard" for evaluation of sleep disordered breathing, we mutually agreed to proceed with a home sleep test at this time.   I had a long chat with the patient about my findings and the diagnosis of sleep apnea, particularly OSA, its prognosis and treatment options. We talked about medical/conservative treatments, surgical interventions and non-pharmacological approaches for symptom control. I explained, in particular, the risks and ramifications of untreated moderate to severe OSA, especially with respect to developing cardiovascular disease down the road, including congestive heart failure (CHF), difficult to treat hypertension, cardiac arrhythmias (particularly A-fib), neurovascular complications including TIA, stroke and dementia. Even type 2 diabetes has, in part, been linked to untreated OSA. Symptoms of untreated OSA may include (but may not be limited to) daytime sleepiness, nocturia (i.e. frequent nighttime urination), memory problems, mood irritability and suboptimally controlled or  worsening mood disorder such as depression and/or anxiety, lack of energy, lack of motivation, physical discomfort, as well as recurrent headaches, especially morning or nocturnal headaches. We talked about the importance of maintaining a healthy lifestyle and striving for healthy weight.  The importance of complete smoking cessation (i.e. cigars) was also addresse.  In addition, we talked about the importance of striving for and maintaining good sleep hygiene. I recommended a sleep study at this time. I outlined the differences between a laboratory attended sleep study which is considered more comprehensive and accurate over the option of a home sleep test (HST); the latter may lead to underestimation of sleep disordered breathing in some instances and does not help with diagnosing upper airway resistance syndrome and is not accurate enough to diagnose primary central sleep apnea typically. I outlined possible surgical and non-surgical treatment options of OSA, including the use of a positive airway pressure (PAP) device (i.e. CPAP, AutoPAP/APAP or BiPAP in certain circumstances), a custom-made dental device (aka oral appliance, which would require a referral to a specialist dentist or orthodontist typically, and is generally speaking not considered for patients with full dentures or edentulous state), upper airway surgical options, such as traditional UPPP (which is not considered a first-line treatment) or the Inspire device (hypoglossal nerve stimulator, which would involve a referral for consultation with an ENT surgeon, after careful selection, following inclusion criteria - also not first-line treatment). I explained the PAP treatment option to the patient in detail, as this is generally considered first-line treatment.  The patient indicated that he would be willing to try PAP therapy, if the need arises. I explained the importance of being compliant with PAP treatment, not only for insurance purposes but  primarily to improve patient's symptoms symptoms, and for the patient's long term health benefit, including to reduce His cardiovascular risks longer-term.    We will pick up our discussion about the next steps and treatment options after testing.  We will keep him posted as to the test results by phone call and/or MyChart messaging where possible.  We will plan to follow-up in sleep clinic accordingly as well.  I answered all his questions today and the patient was in agreement.   I encouraged him to call with any interim questions, concerns, problems or updates or email [place] through MyChart.  Generally speaking, sleep test authorizations may take up to 2 weeks, sometimes less, sometimes longer, the patient is encouraged to get in touch with [place] if they do not hear back from the sleep lab staff directly within the  next 2 weeks.  Thank you very much for allowing me to participate in the care of this nice patient. If I can be of any further assistance to you please do not hesitate to call me at [phone].  Sincerely,   Huston Foley, MD, PhD

## 2022-09-10 ENCOUNTER — Ambulatory Visit: Payer: BC Managed Care – PPO | Admitting: Neurology

## 2022-09-10 DIAGNOSIS — G4733 Obstructive sleep apnea (adult) (pediatric): Secondary | ICD-10-CM

## 2022-09-10 DIAGNOSIS — E669 Obesity, unspecified: Secondary | ICD-10-CM

## 2022-09-10 DIAGNOSIS — Z9189 Other specified personal risk factors, not elsewhere classified: Secondary | ICD-10-CM

## 2022-09-10 DIAGNOSIS — R0683 Snoring: Secondary | ICD-10-CM

## 2022-09-17 ENCOUNTER — Telehealth: Payer: Self-pay

## 2022-09-17 NOTE — Telephone Encounter (Signed)
Spoke to patient about the sleep lab receiving the results of the mail out home sleep study. Report has been generated for the MD to read. Once it is, pt will receive a call to go over the results. Pt was also instructed to now throw away the HST device.

## 2022-09-18 NOTE — Procedures (Signed)
     [facility]  HOME SLEEP TEST (Watch PAT) REPORT - Mail-out Device  STUDY DATE: 09/11/2022  DOB: Oct 25, 1980  MRN: [id number]  ORDERING CLINICIAN: Huston Foley, MD, PhD   REFERRING CLINICIAN: Lula Olszewski, MD   CLINICAL INFORMATION/HISTORY: 42 year old male with an underlying medical history of hypertension, and obesity, who reports snoring and a recent diagnosis of hypertension.   Epworth sleepiness score: 0/24.  BMI: 33.2 kg/m  FINDINGS:   Sleep Summary:   Total Recording Time (hours, min): 9 hours, 2 min  Total Sleep Time (hours, min):  7 hours, 2 min  Percent REM (%):    22.9%   Respiratory Indices:   Calculated pAHI (per hour):  16.2/hour         REM pAHI:    31.4/hour       NREM pAHI: 12.1/hour  Central pAHI: 0.4/hour  Oxygen Saturation Statistics:    Oxygen Saturation (%) Mean: 96%   Minimum oxygen saturation (%):                 89%   O2 Saturation Range (%): 89-99%    O2 Saturation (minutes) <=88%: 0 min  Pulse Rate Statistics:   Pulse Mean (bpm):    80/min    Pulse Range (63-117/min)   IMPRESSION: OSA (obstructive sleep apnea)   RECOMMENDATION:  This home sleep test demonstrates moderate obstructive sleep apnea - by number of events - with a total AHI of 16.2/hour and O2 nadir of 89%.  Intermittent mild to moderate snoring was detected. Treatment with a positive airway pressure (PAP) device is recommended. The patient will be advised to proceed with an autoPAP titration/trial at home for now. A full night titration study may be considered to optimize treatment settings, monitor proper oxygen saturations and aid with improvement of tolerance and adherence, if needed down the road. Alternative treatment options may include a dental device through dentistry or orthodontics in selected patients or Inspire (hypoglossal nerve stimulator) in carefully selected patients (meeting inclusion criteria).  Concomitant weight loss is  recommended (where clinically appropriate). Please note that untreated obstructive sleep apnea may carry additional perioperative morbidity. Patients with significant obstructive sleep apnea should receive perioperative PAP therapy and the surgeons and particularly the anesthesiologist should be informed of the diagnosis and the severity of the sleep disordered breathing. The patient should be cautioned not to drive, work at heights, or operate dangerous or heavy equipment when tired or sleepy. Review and reiteration of good sleep hygiene measures should be pursued with any patient. Other causes of the patient's symptoms, including circadian rhythm disturbances, an underlying mood disorder, medication effect and/or an underlying medical problem cannot be ruled out based on this test. Clinical correlation is recommended.  The patient and his referring provider will be notified of the test results. The patient will be seen in follow up in sleep clinic at [facility].  I certify that I have reviewed the raw data recording prior to the issuance of this report in accordance with the standards of the American Academy of Sleep Medicine (AASM).  INTERPRETING PHYSICIAN:   Huston Foley, MD, PhD Medical Director, [facility]) Diplomat, ABPN (Neurology and Sleep)   [facility] [address] [place], [place] [address] [phone]

## 2022-09-18 NOTE — Addendum Note (Signed)
Addended by: Huston Foley on: 09/18/2022 12:51 PM   Modules accepted: Orders

## 2022-09-22 NOTE — Telephone Encounter (Addendum)
Bobbye Morton, CMA  Zott, Kennyth Arnold; Gamble, Tammy New orders have been placed for the above pt, DOB: 1980-01-27 Thanks  Zott, Stacy  Avarae Zwart, Abbe Amsterdam, CMA; Melvern Sample Got it Thank You

## 2022-09-26 ENCOUNTER — Encounter: Payer: Self-pay | Admitting: Internal Medicine

## 2022-09-26 DIAGNOSIS — G4733 Obstructive sleep apnea (adult) (pediatric): Secondary | ICD-10-CM | POA: Insufficient documentation

## 2022-12-08 ENCOUNTER — Encounter: Payer: BC Managed Care – PPO | Admitting: Family Medicine

## 2023-07-13 ENCOUNTER — Encounter: Payer: BC Managed Care – PPO | Admitting: Internal Medicine

## 2023-10-02 ENCOUNTER — Other Ambulatory Visit: Payer: Self-pay | Admitting: Internal Medicine

## 2023-10-02 DIAGNOSIS — I10 Essential (primary) hypertension: Secondary | ICD-10-CM
